# Patient Record
Sex: Male | Born: 1940 | Race: White | Hispanic: No | Marital: Married | Smoking: Never smoker
Health system: Southern US, Community
[De-identification: ages and names within clinical notes are randomized; demographics above are authoritative.]

## PROBLEM LIST (undated history)

## (undated) DIAGNOSIS — K4090 Unilateral inguinal hernia, without obstruction or gangrene, not specified as recurrent: Secondary | ICD-10-CM

## (undated) DIAGNOSIS — G9389 Other specified disorders of brain: Secondary | ICD-10-CM

## (undated) DIAGNOSIS — H9192 Unspecified hearing loss, left ear: Secondary | ICD-10-CM

## (undated) DIAGNOSIS — Q8502 Neurofibromatosis, type 2: Secondary | ICD-10-CM

## (undated) DIAGNOSIS — Z974 Presence of external hearing-aid: Secondary | ICD-10-CM

---

## 1969-08-24 HISTORY — PX: APPENDECTOMY: SHX54

## 1998-04-23 HISTORY — PX: CRANIOTOMY: SHX93

## 1998-05-21 ENCOUNTER — Inpatient Hospital Stay (HOSPITAL_COMMUNITY): Admission: AD | Admit: 1998-05-21 | Discharge: 1998-05-26 | Payer: Self-pay | Admitting: Family Medicine

## 2008-07-14 ENCOUNTER — Encounter: Admission: RE | Admit: 2008-07-14 | Discharge: 2008-07-14 | Payer: Self-pay | Admitting: Orthopedic Surgery

## 2009-08-09 ENCOUNTER — Encounter: Admission: RE | Admit: 2009-08-09 | Discharge: 2009-08-09 | Payer: Self-pay | Admitting: Orthopedic Surgery

## 2009-08-22 ENCOUNTER — Inpatient Hospital Stay (HOSPITAL_COMMUNITY): Admission: RE | Admit: 2009-08-22 | Discharge: 2009-08-24 | Payer: Self-pay | Admitting: Orthopedic Surgery

## 2009-08-22 HISTORY — PX: TOTAL HIP ARTHROPLASTY: SHX124

## 2010-03-10 ENCOUNTER — Encounter: Admission: RE | Admit: 2010-03-10 | Discharge: 2010-03-10 | Payer: Self-pay | Admitting: Family Medicine

## 2010-12-07 ENCOUNTER — Encounter
Admission: RE | Admit: 2010-12-07 | Discharge: 2010-12-07 | Payer: Self-pay | Source: Home / Self Care | Attending: Neurology | Admitting: Neurology

## 2011-03-30 LAB — CBC
HCT: 23.2 % — ABNORMAL LOW (ref 39.0–52.0)
Hemoglobin: 8.1 g/dL — ABNORMAL LOW (ref 13.0–17.0)
MCHC: 35 g/dL (ref 30.0–36.0)
MCV: 98.2 fL (ref 78.0–100.0)
Platelets: 91 K/uL — ABNORMAL LOW (ref 150–400)
RBC: 2.36 MIL/uL — ABNORMAL LOW (ref 4.22–5.81)
RDW: 13.5 % (ref 11.5–15.5)
WBC: 7.6 K/uL (ref 4.0–10.5)

## 2011-03-30 LAB — BASIC METABOLIC PANEL
BUN: 10 mg/dL (ref 6–23)
Chloride: 101 mEq/L (ref 96–112)
Creatinine, Ser: 0.84 mg/dL (ref 0.4–1.5)

## 2011-03-31 LAB — COMPREHENSIVE METABOLIC PANEL
ALT: 19 U/L (ref 0–53)
AST: 25 U/L (ref 0–37)
Albumin: 4.5 g/dL (ref 3.5–5.2)
Alkaline Phosphatase: 71 U/L (ref 39–117)
BUN: 13 mg/dL (ref 6–23)
Chloride: 100 mEq/L (ref 96–112)
GFR calc Af Amer: >60 mL/min (ref >60)
Potassium: 4.9 mEq/L (ref 3.5–5.1)
Total Bilirubin: 1.3 mg/dL — ABNORMAL HIGH (ref 0.3–1.2)

## 2011-03-31 LAB — URINALYSIS, ROUTINE W REFLEX MICROSCOPIC
Bilirubin Urine: NEGATIVE
Glucose, UA: NEGATIVE mg/dL
Ketones, ur: NEGATIVE mg/dL
pH: 7 (ref 5.0–8.0)

## 2011-03-31 LAB — CROSSMATCH
ABO/RH(D): B NEG
Antibody Screen: NEGATIVE

## 2011-03-31 LAB — DIFFERENTIAL
Basophils Absolute: 0 10*3/uL (ref 0.0–0.1)
Basophils Relative: 1 % (ref 0–1)
Eosinophils Absolute: 0.1 10*3/uL (ref 0.0–0.7)
Eosinophils Relative: 1 % (ref 0–5)
Monocytes Absolute: 1 10*3/uL (ref 0.1–1.0)

## 2011-03-31 LAB — URINE CULTURE
Colony Count: NO GROWTH
Culture: NO GROWTH

## 2011-03-31 LAB — CBC
HCT: 26.8 % — ABNORMAL LOW (ref 39.0–52.0)
HCT: 43.6 % (ref 39.0–52.0)
Hemoglobin: 8.9 g/dL — ABNORMAL LOW (ref 13.0–17.0)
Hemoglobin: 9.3 g/dL — ABNORMAL LOW (ref 13.0–17.0)
MCHC: 34.3 g/dL (ref 30.0–36.0)
MCHC: 34.6 g/dL (ref 30.0–36.0)
MCV: 99.4 fL (ref 78.0–100.0)
Platelets: 105 10*3/uL — ABNORMAL LOW (ref 150–400)
Platelets: 108 10*3/uL — ABNORMAL LOW (ref 150–400)
Platelets: 161 10*3/uL (ref 150–400)
RDW: 13.4 % (ref 11.5–15.5)
RDW: 13.8 % (ref 11.5–15.5)
WBC: 7.6 10*3/uL (ref 4.0–10.5)

## 2011-03-31 LAB — BASIC METABOLIC PANEL
BUN: 9 mg/dL (ref 6–23)
CO2: 26 mEq/L (ref 19–32)
GFR calc non Af Amer: >60 mL/min (ref >60)
Glucose, Bld: 119 mg/dL — ABNORMAL HIGH (ref 70–99)
Potassium: 3.5 mEq/L (ref 3.5–5.1)

## 2011-05-08 NOTE — Op Note (Signed)
[name], [name] NO.:  [id number]   MEDICAL RECORD NO.:  [id number]          PATIENT TYPE:  INP   LOCATION:  5005                         FACILITY:  [facility]   PHYSICIAN:  Mila Homer. Sherlean Foot, M.D. DATE OF BIRTH:  April 07, 1941   DATE OF PROCEDURE:  08/22/2009  DATE OF DISCHARGE:                               OPERATIVE REPORT   SURGEON:  Mila Homer. Sherlean Foot, MD   ASSISTANT:  1. Altamese Cabal, PA-C  2. Laural Benes. Su Hilt, PA-C   ANESTHESIA:  General.   PREOPERATIVE DIAGNOSIS:  Right hip osteoarthritis.   POSTOPERATIVE DIAGNOSIS:  Right hip osteoarthritis.   PROCEDURE:  Right total hip arthroplasty.   INDICATIONS FOR PROCEDURE:  The patient is a 70 year old white male with  failure of conservative measures for osteoarthritis of the hip.  Informed consent was obtained.   DESCRIPTION OF PROCEDURE:  The patient was laid supine and administered  general anesthesia, placed in left down and right up lateral decubitus  position.  After sterile prep and drape, the curvilinear incision was  centered over the trochanter.  I then made an incision in the fascia  lata, put in a holder in place, retracted with a Charnley retractor.  I  obtained hemostasis in the soft tissues.  I then elevated the anterior  one-half of the vastus lateralis, gluteus medius, and all the minimus  with stay sutures.  I then performed an anterior hip capsulectomy.  I  then marked out the neck cut and I made that cut with a sagittal saw.  I  removed the head and neck segment.  I then placed a Hohmann retractor  anterior and posterior to the acetabulum and switched sides of the table  with my PAs.  I then removed the labrum circumferentially.  I then  reamed up to 56-mm and put in a no holes, no spikes, fully fiber mesh  cup.  I snapped in a standard 2+ x  32-mm liner.  I went to the back  side of the table, flexed the knee and hip into a sterile pouch.  I then  gained access to the femoral canal,  reamed to size 12, broached to size  12, trialed with various head sizes, and chose a +35 head x 32-mm head.  I removed the trial components, copiously irrigated, tamped down a fully  porous coated stem, tamped on a +35 x 32-mm ball and then located the  hip.  Took it through aggressive range of motion, had good range of  motion.  No instability.  I then closed the lateralis medius minimus  sleeve through drill holes in the trochanter.  I then closed the fascia  lata with running #1 Vicryl sutures, deep soft tissue with buried 0-  Vicryl suture, subcuticular with 2-0 Vicryl stitch, and skin staples.   COMPLICATIONS:  None.   DRAINS:  None.           ______________________________  Mila Homer. Sherlean Foot, M.D.     SDL/MEDQ  D:  08/23/2009  T:  08/24/2009  Job:  [id number]

## 2011-11-22 ENCOUNTER — Other Ambulatory Visit: Payer: Self-pay | Admitting: Family Medicine

## 2011-11-23 ENCOUNTER — Ambulatory Visit
Admission: RE | Admit: 2011-11-23 | Discharge: 2011-11-23 | Disposition: A | Discharge status: Home / Self Care | Payer: Medicare Other | Source: Ambulatory Visit | Attending: Family Medicine | Admitting: Family Medicine

## 2011-11-23 DIAGNOSIS — R519 Headache, unspecified: Secondary | ICD-10-CM

## 2015-03-15 ENCOUNTER — Ambulatory Visit (INDEPENDENT_AMBULATORY_CARE_PROVIDER_SITE_OTHER): Payer: Medicare Other

## 2015-03-15 ENCOUNTER — Ambulatory Visit (INDEPENDENT_AMBULATORY_CARE_PROVIDER_SITE_OTHER): Payer: Medicare Other | Admitting: Podiatry

## 2015-03-15 VITALS — BP 122/66 | HR 68 | Resp 15

## 2015-03-15 DIAGNOSIS — M79675 Pain in left toe(s): Secondary | ICD-10-CM

## 2015-03-15 DIAGNOSIS — M8430XA Stress fracture, unspecified site, initial encounter for fracture: Secondary | ICD-10-CM | POA: Diagnosis not present

## 2015-03-15 DIAGNOSIS — M779 Enthesopathy, unspecified: Secondary | ICD-10-CM | POA: Diagnosis not present

## 2015-03-15 MED ORDER — GABAPENTIN 400 MG PO CAPS: 400.0000 mg | ORAL_CAPSULE | Freq: Two times a day (BID) | ORAL | Status: DC

## 2015-03-15 MED ORDER — TRIAMCINOLONE ACETONIDE 10 MG/ML IJ SUSP
10.0000 mg | Freq: Once | INTRAMUSCULAR | Status: AC
  Administered 2015-03-15: 10 mg

## 2015-03-15 NOTE — Progress Notes (Signed)
   Subjective:    Patient ID: Cristian Larson, male    DOB: 1941-12-09, 74 y.o.   MRN: [id number]  HPI Pt presents with pain in 2nd met, area reddened and slightly swollen   Review of Systems  All other systems reviewed and are negative.      Objective:   Physical Exam        Assessment & Plan:

## 2015-03-16 NOTE — Progress Notes (Signed)
Subjective:     Patient ID: Cristian Larson, male   DOB: 06/11/1941, 74 y.o.   MRN: [id number]  HPI patient presents stating I'm having a lot of pain in my second and third metatarsal joint and I get shooting burning pains with certain motions and I have been skiing twice in the last month and it seemed to worsen the condition   Review of Systems  All other systems reviewed and are negative.      Objective:   Physical Exam  Constitutional: He is oriented to person, place, and time.  Cardiovascular: Intact distal pulses.   Abdominal: Distention: possibility for inflammatory capsulitis mostly centered in the third MPJ versus possibility for stress fracture or possible nerve injury secondary to tight.  Musculoskeletal: Normal range of motion.  Neurological: He is oriented to person, place, and time.  Skin: Skin is warm.  Nursing note and vitals reviewed.  her vascular status intact with muscle strength adequate and range of motion adequate. He's got lesser digital deformity of the left foot with elevation of the second and third toes mild redness on top of the second toe left. He does have discomfort in the third metatarsal phalangeal joint moderate discomfort around the second and third metatarsal shaft and discomfort into the arch and also complains at times that is dorsal ankle seems to bother him. No specific element to this condition and it seems to occur with different types of activity      Assessment:     Ski boot that he wore. Rest of condition is listed above    Plan:     H&P and x-rays reviewed. I did do a proximal block around the third MPJ aspirated the joint giving out a small amount of clear fluid and injected with a quarter cc of dexamethasone Kenalog. I tried apply a fascially brace and then a short air fracture walker neither which patient could tolerate and at this time we'll start him on gabapentin 400 mg and see if it reduces any nerve irritation that might be present.  We will have to keep a close eye on this and decide if anything else is necessary and he will be seen back in 1 week

## 2015-03-21 ENCOUNTER — Telehealth: Payer: Self-pay | Admitting: *Deleted

## 2015-03-21 NOTE — Telephone Encounter (Addendum)
Pt states he had questions for Dr. Paulla Dolly.  I told him I may be able to help him and I would let Dr. Paulla Dolly know of his concerns.  Pt asked if he was to be weight bearing or what he need to be doing for his foot.  I reviewed pt's last visit, and instructed pt to wear a stiff shoe, and rest to decrease the movement of the structures of the foot that cause the foot to continue to irritate and injure the site, I also encouraged the pt to ice the foot 10 - 15 minutes 3 - 4 times a day to decrease pain and swelling and local inflammation.  I told pt I would call again if Dr. Paulla Dolly recommended changes.  Left message asking pt his status and to call with status.

## 2015-03-23 NOTE — Telephone Encounter (Signed)
Please call him and ask if he is improving. If not we may need to order test to see if he has stress fracture

## 2015-03-29 ENCOUNTER — Telehealth: Payer: Self-pay | Admitting: *Deleted

## 2015-03-29 NOTE — Telephone Encounter (Signed)
Pt states Dr. Paulla Dolly is treating for stress fracture, and he would like to know how long it takes to heal, how long should he weight bear, and he is currently in a regular shoe.  I told pt that it generally took 4 - 6 weeks for the bone callous to form around the fracture and to be seen on x-ray,  I told pt he should do only ADL, not a great deal of walking, and remain in a stiff shoe.  Pt asked if he would have permanent problem with the foot.  I told he routinely the stress fracture healed fine if immobilized in a stiff boot or shoe, but other factors like age, activity, location of fx,  also could affect.  Pt denies any pain and I told him I would advise Dr. Paulla Dolly of the pt condition.

## 2015-03-31 ENCOUNTER — Ambulatory Visit (INDEPENDENT_AMBULATORY_CARE_PROVIDER_SITE_OTHER): Payer: Medicare Other | Admitting: Podiatry

## 2015-03-31 ENCOUNTER — Ambulatory Visit (INDEPENDENT_AMBULATORY_CARE_PROVIDER_SITE_OTHER): Payer: Medicare Other

## 2015-03-31 ENCOUNTER — Encounter: Payer: Self-pay | Admitting: Podiatry

## 2015-03-31 VITALS — BP 117/71 | HR 69 | Resp 12

## 2015-03-31 DIAGNOSIS — M779 Enthesopathy, unspecified: Secondary | ICD-10-CM

## 2015-03-31 DIAGNOSIS — R52 Pain, unspecified: Secondary | ICD-10-CM | POA: Diagnosis not present

## 2015-04-03 NOTE — Progress Notes (Signed)
Subjective:     Patient ID: Cristian Larson, male   DOB: Apr 07, 1941, 74 y.o.   MRN: [id number]  HPI patient states I'm still getting pain in my left foot but it seems to have changed and is no longer as sharp as it was. I'm not sure if I have a fracture or not   Review of Systems     Objective:   Physical Exam Neurovascular status was found to be intact with diminished inflammation second MPJ left with moderate forefoot discomfort but no specific area of pain noted. Neuro tingling seems to have reduced with normal neurological sharp dull and vibratory sensation noted    Assessment:     Possibility for stress fracture versus inflammatory condition secondary to excessive activity    Plan:     Re-x-ray the foot and advised on gradual increase in activity ice therapy and support. We discussed a boutonniere get a hold off at the current time and less symptoms were to progress

## 2015-09-06 ENCOUNTER — Encounter (HOSPITAL_COMMUNITY): Payer: Self-pay | Admitting: Emergency Medicine

## 2015-09-06 ENCOUNTER — Emergency Department (HOSPITAL_COMMUNITY)
Admission: EM | Admit: 2015-09-06 | Discharge: 2015-09-06 | Disposition: A | Discharge status: Home / Self Care | Payer: Medicare Other | Attending: Emergency Medicine | Admitting: Emergency Medicine

## 2015-09-06 ENCOUNTER — Emergency Department (HOSPITAL_COMMUNITY): Payer: Medicare Other

## 2015-09-06 DIAGNOSIS — W01198A Fall on same level from slipping, tripping and stumbling with subsequent striking against other object, initial encounter: Secondary | ICD-10-CM | POA: Diagnosis not present

## 2015-09-06 DIAGNOSIS — Y9289 Other specified places as the place of occurrence of the external cause: Secondary | ICD-10-CM | POA: Diagnosis not present

## 2015-09-06 DIAGNOSIS — S0101XA Laceration without foreign body of scalp, initial encounter: Secondary | ICD-10-CM | POA: Diagnosis not present

## 2015-09-06 DIAGNOSIS — S0990XA Unspecified injury of head, initial encounter: Secondary | ICD-10-CM | POA: Diagnosis present

## 2015-09-06 DIAGNOSIS — Y998 Other external cause status: Secondary | ICD-10-CM | POA: Insufficient documentation

## 2015-09-06 DIAGNOSIS — Z79899 Other long term (current) drug therapy: Secondary | ICD-10-CM | POA: Insufficient documentation

## 2015-09-06 DIAGNOSIS — R55 Syncope and collapse: Secondary | ICD-10-CM | POA: Diagnosis not present

## 2015-09-06 DIAGNOSIS — Y9389 Activity, other specified: Secondary | ICD-10-CM | POA: Insufficient documentation

## 2015-09-06 DIAGNOSIS — W19XXXA Unspecified fall, initial encounter: Secondary | ICD-10-CM

## 2015-09-06 LAB — CBC WITH DIFFERENTIAL/PLATELET
BASOS ABS: 0 10*3/uL (ref 0.0–0.1)
BASOS PCT: 0 % (ref 0–1)
EOS ABS: 0 10*3/uL (ref 0.0–0.7)
EOS PCT: 0 % (ref 0–5)
HCT: 41.9 % (ref 39.0–52.0)
Hemoglobin: 14.2 g/dL (ref 13.0–17.0)
Lymphocytes Relative: 13 % (ref 12–46)
Lymphs Abs: 1.5 10*3/uL (ref 0.7–4.0)
MCH: 32.6 pg (ref 26.0–34.0)
MCHC: 33.9 g/dL (ref 30.0–36.0)
MCV: 96.1 fL (ref 78.0–100.0)
MONO ABS: 1 10*3/uL (ref 0.1–1.0)
Monocytes Relative: 10 % (ref 3–12)
Neutro Abs: 8.4 10*3/uL — ABNORMAL HIGH (ref 1.7–7.7)
Neutrophils Relative %: 77 % (ref 43–77)
PLATELETS: 142 10*3/uL — AB (ref 150–400)
RBC: 4.36 MIL/uL (ref 4.22–5.81)
RDW: 13 % (ref 11.5–15.5)
WBC: 11 10*3/uL — ABNORMAL HIGH (ref 4.0–10.5)

## 2015-09-06 LAB — COMPREHENSIVE METABOLIC PANEL
ALT: 15 U/L — AB (ref 17–63)
AST: 24 U/L (ref 15–41)
Albumin: 3.8 g/dL (ref 3.5–5.0)
Alkaline Phosphatase: 51 U/L (ref 38–126)
Anion gap: 6 (ref 5–15)
BILIRUBIN TOTAL: 0.5 mg/dL (ref 0.3–1.2)
BUN: 13 mg/dL (ref 6–20)
CALCIUM: 8.7 mg/dL — AB (ref 8.9–10.3)
CO2: 27 mmol/L (ref 22–32)
CREATININE: 1.02 mg/dL (ref 0.61–1.24)
Chloride: 102 mmol/L (ref 101–111)
Glucose, Bld: 102 mg/dL — ABNORMAL HIGH (ref 65–99)
Potassium: 4.2 mmol/L (ref 3.5–5.1)
Sodium: 135 mmol/L (ref 135–145)
TOTAL PROTEIN: 6 g/dL — AB (ref 6.5–8.1)

## 2015-09-06 LAB — URINALYSIS, ROUTINE W REFLEX MICROSCOPIC
Bilirubin Urine: NEGATIVE
GLUCOSE, UA: NEGATIVE mg/dL
HGB URINE DIPSTICK: NEGATIVE
Ketones, ur: 15 mg/dL — AB
Leukocytes, UA: NEGATIVE
Nitrite: NEGATIVE
PROTEIN: NEGATIVE mg/dL
Specific Gravity, Urine: 1.015 (ref 1.005–1.030)
Urobilinogen, UA: 0.2 mg/dL (ref 0.0–1.0)
pH: 7.5 (ref 5.0–8.0)

## 2015-09-06 LAB — PROTIME-INR
INR: 1.1 (ref 0.00–1.49)
PROTHROMBIN TIME: 14.4 s (ref 11.6–15.2)

## 2015-09-06 LAB — TROPONIN I

## 2015-09-06 MED ORDER — LIDOCAINE-EPINEPHRINE (PF) 2 %-1:200000 IJ SOLN
10.0000 mL | Freq: Once | INTRAMUSCULAR | Status: AC
  Administered 2015-09-06: 10 mL
  Filled 2015-09-06: qty 20

## 2015-09-06 MED ORDER — SODIUM CHLORIDE 0.9 % IV BOLUS (SEPSIS)
500.0000 mL | Freq: Once | INTRAVENOUS | Status: AC
  Administered 2015-09-06: 500 mL via INTRAVENOUS

## 2015-09-06 NOTE — ED Notes (Signed)
Patient comes from [facility] at [place] states he was getting out the Hot tube and fell and hit head. Patient has Laceration to the back of the head. Patient is Alert and Oriented on arrival. Bleeding is controled. Patient is not on any Blood thinner. Patient States he does not remember what happen.

## 2015-09-06 NOTE — Discharge Instructions (Signed)
Syncope °Syncope is a medical term for fainting or passing out. This means you lose consciousness and drop to the ground. People are generally unconscious for less than 5 minutes. You may have some muscle twitches for up to 15 seconds before waking up and returning to normal. Syncope occurs more often in older adults, but it can happen to anyone. While most causes of syncope are not dangerous, syncope can be a sign of a serious medical problem. It is important to seek medical care.  °CAUSES  °Syncope is caused by a sudden drop in blood flow to the brain. The specific cause is often not determined. Factors that can bring on syncope include: °· Taking medicines that lower blood pressure. °· Sudden changes in posture, such as standing up quickly. °· Taking more medicine than prescribed. °· Standing in one place for too long. °· Seizure disorders. °· Dehydration and excessive exposure to heat. °· Low blood sugar (hypoglycemia). °· Straining to have a bowel movement. °· Heart disease, irregular heartbeat, or other circulatory problems. °· Fear, emotional distress, seeing blood, or severe pain. °SYMPTOMS  °Right before fainting, you may: °· Feel dizzy or light-headed. °· Feel nauseous. °· See all white or all black in your field of vision. °· Have cold, clammy skin. °DIAGNOSIS  °Your health care provider will ask about your symptoms, perform a physical exam, and perform an electrocardiogram (ECG) to record the electrical activity of your heart. Your health care provider may also perform other heart or blood tests to determine the cause of your syncope which may include: °· Transthoracic echocardiogram (TTE). During echocardiography, sound waves are used to evaluate how blood flows through your heart. °· Transesophageal echocardiogram (TEE). °· Cardiac monitoring. This allows your health care provider to monitor your heart rate and rhythm in real time. °· Holter monitor. This is a portable device that records your  heartbeat and can help diagnose heart arrhythmias. It allows your health care provider to track your heart activity for several days, if needed. °· Stress tests by exercise or by giving medicine that makes the heart beat faster. °TREATMENT  °In most cases, no treatment is needed. Depending on the cause of your syncope, your health care provider may recommend changing or stopping some of your medicines. °HOME CARE INSTRUCTIONS °· Have someone stay with you until you feel stable. °· Do not drive, use machinery, or play sports until your health care provider says it is okay. °· Keep all follow-up appointments as directed by your health care provider. °· Lie down right away if you start feeling like you might faint. Breathe deeply and steadily. Wait until all the symptoms have passed. °· Drink enough fluids to keep your urine clear or pale yellow. °· If you are taking blood pressure or heart medicine, get up slowly and take several minutes to sit and then stand. This can reduce dizziness. °SEEK IMMEDIATE MEDICAL CARE IF:  °· You have a severe headache. °· You have unusual pain in the chest, abdomen, or back. °· You are bleeding from your mouth or rectum, or you have black or tarry stool. °· You have an irregular or very fast heartbeat. °· You have pain with breathing. °· You have repeated fainting or seizure-like jerking during an episode. °· You faint when sitting or lying down. °· You have confusion. °· You have trouble walking. °· You have severe weakness. °· You have vision problems. °If you fainted, call your local emergency services (911 in [place] Do not drive   yourself to the hospital.  MAKE SURE YOU:  Understand these instructions.  Will watch your condition.  Will get help right away if you are not doing well or get worse. Document Released: 12/10/2005 Document Revised: 12/15/2013 Document Reviewed: 02/08/2012 [facility] Patient Information 2015 [place], [place]. This information is not intended to replace  advice given to you by your health care provider. Make sure you discuss any questions you have with your health care provider. Laceration Care, Adult A laceration is a cut or lesion that goes through all layers of the skin and into the tissue just beneath the skin. TREATMENT  Some lacerations may not require closure. Some lacerations may not be able to be closed due to an increased risk of infection. It is important to see your caregiver as soon as possible after an injury to minimize the risk of infection and maximize the opportunity for successful closure. If closure is appropriate, pain medicines may be given, if needed. The wound will be cleaned to help prevent infection. Your caregiver will use stitches (sutures), staples, wound glue (adhesive), or skin adhesive strips to repair the laceration. These tools bring the skin edges together to allow for faster healing and a better cosmetic outcome. However, all wounds will heal with a scar. Once the wound has healed, scarring can be minimized by covering the wound with sunscreen during the day for 1 full year. HOME CARE INSTRUCTIONS  For sutures or staples:  Keep the wound clean and dry.  If you were given a bandage (dressing), you should change it at least once a day. Also, change the dressing if it becomes wet or dirty, or as directed by your caregiver.  Wash the wound with soap and water 2 times a day. Rinse the wound off with water to remove all soap. Pat the wound dry with a clean towel.  After cleaning, apply a thin layer of the antibiotic ointment as recommended by your caregiver. This will help prevent infection and keep the dressing from sticking.  You may shower as usual after the first 24 hours. Do not soak the wound in water until the sutures are removed.  Only take over-the-counter or prescription medicines for pain, discomfort, or fever as directed by your caregiver.  Get your sutures or staples removed as directed by your  caregiver. For skin adhesive strips:  Keep the wound clean and dry.  Do not get the skin adhesive strips wet. You may bathe carefully, using caution to keep the wound dry.  If the wound gets wet, pat it dry with a clean towel.  Skin adhesive strips will fall off on their own. You may trim the strips as the wound heals. Do not remove skin adhesive strips that are still stuck to the wound. They will fall off in time. For wound adhesive:  You may briefly wet your wound in the shower or bath. Do not soak or scrub the wound. Do not swim. Avoid periods of heavy perspiration until the skin adhesive has fallen off on its own. After showering or bathing, gently pat the wound dry with a clean towel.  Do not apply liquid medicine, cream medicine, or ointment medicine to your wound while the skin adhesive is in place. This may loosen the film before your wound is healed.  If a dressing is placed over the wound, be careful not to apply tape directly over the skin adhesive. This may cause the adhesive to be pulled off before the wound is healed.  Avoid prolonged  exposure to sunlight or tanning lamps while the skin adhesive is in place. Exposure to ultraviolet light in the first year will darken the scar.  The skin adhesive will usually remain in place for 5 to 10 days, then naturally fall off the skin. Do not pick at the adhesive film. You may need a tetanus shot if:  You cannot remember when you had your last tetanus shot.  You have never had a tetanus shot. If you get a tetanus shot, your arm may swell, get red, and feel warm to the touch. This is common and not a problem. If you need a tetanus shot and you choose not to have one, there is a rare chance of getting tetanus. Sickness from tetanus can be serious. SEEK MEDICAL CARE IF:   You have redness, swelling, or increasing pain in the wound.  You see a red line that goes away from the wound.  You have yellowish-white fluid (pus) coming from  the wound.  You have a fever.  You notice a bad smell coming from the wound or dressing.  Your wound breaks open before or after sutures have been removed.  You notice something coming out of the wound such as wood or glass.  Your wound is on your hand or foot and you cannot move a finger or toe. SEEK IMMEDIATE MEDICAL CARE IF:   Your pain is not controlled with prescribed medicine.  You have severe swelling around the wound causing pain and numbness or a change in color in your arm, hand, leg, or foot.  Your wound splits open and starts bleeding.  You have worsening numbness, weakness, or loss of function of any joint around or beyond the wound.  You develop painful lumps near the wound or on the skin anywhere on your body. MAKE SURE YOU:   Understand these instructions.  Will watch your condition.  Will get help right away if you are not doing well or get worse. Document Released: 12/10/2005 Document Revised: 03/03/2012 Document Reviewed: 06/05/2011 [facility] Patient Information 2015 [place], [place]. This information is not intended to replace advice given to you by your health care provider. Make sure you discuss any questions you have with your health care provider. Head Injury You have received a head injury. It does not appear serious at this time. Headaches and vomiting are common following head injury. It should be easy to awaken from sleeping. Sometimes it is necessary for you to stay in the emergency department for a while for observation. Sometimes admission to the hospital may be needed. After injuries such as yours, most problems occur within the first 24 hours, but side effects may occur up to 7-10 days after the injury. It is important for you to carefully monitor your condition and contact your health care provider or seek immediate medical care if there is a change in your condition. WHAT ARE THE TYPES OF HEAD INJURIES? Head injuries can be as minor as a bump. Some  head injuries can be more severe. More severe head injuries include:  A jarring injury to the brain (concussion).  A bruise of the brain (contusion). This mean there is bleeding in the brain that can cause swelling.  A cracked skull (skull fracture).  Bleeding in the brain that collects, clots, and forms a bump (hematoma). WHAT CAUSES A HEAD INJURY? A serious head injury is most likely to happen to someone who is in a car wreck and is not wearing a seat belt. Other causes of  major head injuries include bicycle or motorcycle accidents, sports injuries, and falls. HOW ARE HEAD INJURIES DIAGNOSED? A complete history of the event leading to the injury and your current symptoms will be helpful in diagnosing head injuries. Many times, pictures of the brain, such as CT or MRI are needed to see the extent of the injury. Often, an overnight hospital stay is necessary for observation.  WHEN SHOULD I SEEK IMMEDIATE MEDICAL CARE?  You should get help right away if:  You have confusion or drowsiness.  You feel sick to your stomach (nauseous) or have continued, forceful vomiting.  You have dizziness or unsteadiness that is getting worse.  You have severe, continued headaches not relieved by medicine. Only take over-the-counter or prescription medicines for pain, fever, or discomfort as directed by your health care provider.  You do not have normal function of the arms or legs or are unable to walk.  You notice changes in the black spots in the center of the colored part of your eye (pupil).  You have a clear or bloody fluid coming from your nose or ears.  You have a loss of vision. During the next 24 hours after the injury, you must stay with someone who can watch you for the warning signs. This person should contact local emergency services (911 in the [place] if you have seizures, you become unconscious, or you are unable to wake up. HOW CAN I PREVENT A HEAD INJURY IN THE FUTURE? The most  important factor for preventing major head injuries is avoiding motor vehicle accidents. To minimize the potential for damage to your head, it is crucial to wear seat belts while riding in motor vehicles. Wearing helmets while bike riding and playing collision sports (like football) is also helpful. Also, avoiding dangerous activities around the house will further help reduce your risk of head injury.  WHEN CAN I RETURN TO NORMAL ACTIVITIES AND ATHLETICS? You should be reevaluated by your health care provider before returning to these activities. If you have any of the following symptoms, you should not return to activities or contact sports until 1 week after the symptoms have stopped:  Persistent headache.  Dizziness or vertigo.  Poor attention and concentration.  Confusion.  Memory problems.  Nausea or vomiting.  Fatigue or tire easily.  Irritability.  Intolerant of bright lights or loud noises.  Anxiety or depression.  Disturbed sleep. MAKE SURE YOU:   Understand these instructions.  Will watch your condition.  Will get help right away if you are not doing well or get worse. Document Released: 12/10/2005 Document Revised: 12/15/2013 Document Reviewed: 08/17/2013 [facility] Patient Information 2015 [place], [place]. This information is not intended to replace advice given to you by your health care provider. Make sure you discuss any questions you have with your health care provider.

## 2015-09-06 NOTE — ED Notes (Signed)
MD made aware patient requested to leave. MD advised to let patient know she will be there in a few. Patient advised.

## 2015-09-06 NOTE — ED Provider Notes (Signed)
CSN: [id number]     Arrival date & time 09/06/15  1723 History   First MD Initiated Contact with Patient 09/06/15 1816     Chief Complaint  Patient presents with  . Fall     (Consider location/radiation/quality/duration/timing/severity/associated sxs/prior Treatment) HPI Patient reports he was getting out of his hot And going over the pool when he fell and hit his head. He is not sure if he got dizzy and passed out or if he tripped. He thinks he may have been slightly lightheaded and then tripped. He hit the back of his head and has a laceration. Patient states he has very poor memory at baseline so it would not be unusual for him not to recall the events surrounding the episode.. This has been reportedly assessed on an outpatient basis and the patient does not have any formal dementia diagnosis by his report. Patient denies he experienced any chest pain, palpitation. He reports that he has had some mild diarrheal illness and nausea recently. He states he traveled to [facility] over the weekend and had some minor symptoms that he attributed to food poisoning. He reports he had not actually had anything to eat today. He denies any alcohol use. He denies any significant medical history. History reviewed. No pertinent past medical history. Past Surgical History  Procedure Laterality Date  . Appendectomy     No family history on file. Social History  Substance Use Topics  . Smoking status: Never Smoker   . Smokeless tobacco: None  . Alcohol Use: 0.0 oz/week    0 Standard drinks or equivalent per week    Review of Systems 10 Systems reviewed and are negative for acute change except as noted in the HPI.   Allergies  Review of patient's allergies indicates no known allergies.  Home Medications   Prior to Admission medications   Medication Sig Start Date End Date Taking? 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   Yes Historical Provider, MD  tamsulosin (FLOMAX) 0.4 MG CAPS capsule Take 0.4 mg by mouth daily after supper.  08/25/15  Yes Historical Provider, MD   BP 119/65 mmHg  Pulse 70  Temp(Src) 97.7 F (36.5 C) (Oral)  Resp 19  SpO2 99% Physical Exam  Constitutional: He is oriented to person, place, and time. He appears well-developed and well-nourished.  HENT:  1.5 cm linear laceration to the left parietal scalp. No active bleeding at this time.  Internal hearing aid in the right ear canal, left ear canal normal no hemotympanum.  No oral or dental injury.  Eyes: EOM are normal. Pupils are equal, round, and reactive to light.  Neck: Neck supple.  Cardiovascular: Normal rate, regular rhythm, normal heart sounds and intact distal pulses.   Pulmonary/Chest: Effort normal and breath sounds normal.  Abdominal: Soft. Bowel sounds are normal. He exhibits no distension. There is no tenderness.  Musculoskeletal: Normal range of motion. He exhibits no edema.  Neurological: He is alert and oriented to person, place, and time. He has normal strength. No cranial nerve deficit. He exhibits normal muscle tone. Coordination normal. GCS eye subscore is 4. GCS verbal subscore is 5. GCS motor subscore is 6.  Skin: Skin is warm, dry and intact.  Psychiatric: He has a normal mood and affect.    ED Course  LACERATION REPAIR Date/Time: 09/09/2015 3:15 PM Performed by: Charlesetta Shanks Authorized by: Charlesetta Shanks Body area: head/neck Location details: scalp  Laceration length: 1.5 cm Foreign bodies: no foreign bodies Tendon involvement: none Nerve involvement: none Vascular damage: no Anesthesia: local infiltration Local anesthetic: lidocaine 1% without epinephrine Irrigation solution: saline Amount of cleaning: standard Debridement: none Degree of undermining: none Skin closure: staples Number of sutures: 2 Technique: simple Approximation: close Approximation difficulty: simple    (including critical care time)  Labs Review Labs Reviewed  COMPREHENSIVE METABOLIC PANEL - Abnormal; Notable for the following:    Glucose, Bld 102 (*)    Calcium 8.7 (*)    Total Protein 6.0 (*)    ALT 15 (*)    All other components within normal limits  CBC WITH DIFFERENTIAL/PLATELET - Abnormal; Notable for the following:    WBC 11.0 (*)    Platelets 142 (*)    Neutro Abs 8.4 (*)    All other components within normal limits  URINALYSIS, ROUTINE W REFLEX MICROSCOPIC (NOT AT [facility]) - Abnormal; Notable for the following:    APPearance HAZY (*)    Ketones, ur 15 (*)    All other components within normal limits  TROPONIN I  PROTIME-INR    Imaging Review No results found. I have personally reviewed and evaluated these images and lab results as part of my medical decision-making.   EKG Interpretation   Date/Time:  Tuesday September 06 2015 17:30:44 EDT Ventricular Rate:  66 PR Interval:  207 QRS Duration: 91 QT Interval:  394 QTC Calculation: 413 R Axis:   17 Text Interpretation:  Sinus rhythm Probable left atrial enlargement  Minimal ST elevation, inferior leads ED PHYSICIAN INTERPRETATION AVAILABLE  IN [facility] Confirmed by TEST, Record ([id number]) on 09/07/2015 8:03:39  AM      MDM   Final diagnoses:  Fall, initial encounter  Near syncope  Scalp laceration, initial encounter   Patient presents with fall consistent with a likely orthostatic near syncope with loss of balance. Patient had not had anything to eat today and had been in his hot tub was stepping out of the pool. He thinks he might of been a little lightheaded causing himself to trip and fall. He hit the back of his head and has a laceration. He went to see his family doctor and they sent him to the emergency department for further evaluation. The patient is alert and appropriate. There is no evidence of other additional injury. CT head and cervical spine do not show acute intracranial or cervical injury.  Patient is neurologically intact and appropriate. Staples were used for closure of a simple scalp laceration. Patient is not on any antiplatelets. He is discharged with his wife with head injury instructions.    Charlesetta Shanks, MD 09/09/15 1520

## 2015-09-06 NOTE — ED Notes (Signed)
Laceration noted to be about 1cm

## 2016-05-17 ENCOUNTER — Ambulatory Visit: Payer: Self-pay | Admitting: General Surgery

## 2016-06-13 ENCOUNTER — Encounter (HOSPITAL_BASED_OUTPATIENT_CLINIC_OR_DEPARTMENT_OTHER): Payer: Self-pay | Admitting: *Deleted

## 2016-06-13 NOTE — Progress Notes (Signed)
NPO AFTER MN.  ARRIVE AT 0915.  NEEDS HG.  WILL DO HIBICLENS SHOWER HS BEFORE AND AM DOS.

## 2016-07-03 ENCOUNTER — Encounter (HOSPITAL_BASED_OUTPATIENT_CLINIC_OR_DEPARTMENT_OTHER): Payer: Self-pay | Admitting: *Deleted

## 2016-07-03 NOTE — Progress Notes (Signed)
SPOKE W/ PT'S WIFE.  NPO AFTER MN.  ARRIVE AT 1000.  NEEDS HG.  WILL DO HIBICLENS SHOWER HS BEFORE AND AM DOS .

## 2016-07-12 ENCOUNTER — Encounter (HOSPITAL_BASED_OUTPATIENT_CLINIC_OR_DEPARTMENT_OTHER): Payer: Self-pay

## 2016-07-12 ENCOUNTER — Ambulatory Visit (HOSPITAL_BASED_OUTPATIENT_CLINIC_OR_DEPARTMENT_OTHER)
Admission: RE | Admit: 2016-07-12 | Discharge: 2016-07-12 | Disposition: A | Discharge status: Home / Self Care | Payer: Medicare Other | Source: Ambulatory Visit | Attending: General Surgery | Admitting: General Surgery

## 2016-07-12 ENCOUNTER — Ambulatory Visit (HOSPITAL_BASED_OUTPATIENT_CLINIC_OR_DEPARTMENT_OTHER): Payer: Medicare Other | Admitting: Anesthesiology

## 2016-07-12 ENCOUNTER — Encounter (HOSPITAL_BASED_OUTPATIENT_CLINIC_OR_DEPARTMENT_OTHER)
Admission: RE | Disposition: A | Discharge status: Home / Self Care | Payer: Self-pay | Source: Ambulatory Visit | Attending: General Surgery

## 2016-07-12 DIAGNOSIS — Z9049 Acquired absence of other specified parts of digestive tract: Secondary | ICD-10-CM | POA: Diagnosis not present

## 2016-07-12 DIAGNOSIS — Z96641 Presence of right artificial hip joint: Secondary | ICD-10-CM | POA: Insufficient documentation

## 2016-07-12 DIAGNOSIS — D1779 Benign lipomatous neoplasm of other sites: Secondary | ICD-10-CM | POA: Insufficient documentation

## 2016-07-12 DIAGNOSIS — Z79899 Other long term (current) drug therapy: Secondary | ICD-10-CM | POA: Diagnosis not present

## 2016-07-12 DIAGNOSIS — K4090 Unilateral inguinal hernia, without obstruction or gangrene, not specified as recurrent: Secondary | ICD-10-CM | POA: Diagnosis not present

## 2016-07-12 HISTORY — DX: Neurofibromatosis, type 2: Q85.02

## 2016-07-12 HISTORY — PX: INSERTION OF MESH: SHX5868

## 2016-07-12 HISTORY — DX: Presence of external hearing-aid: Z97.4

## 2016-07-12 HISTORY — DX: Unilateral inguinal hernia, without obstruction or gangrene, not specified as recurrent: K40.90

## 2016-07-12 HISTORY — DX: Other specified disorders of brain: G93.89

## 2016-07-12 HISTORY — PX: INGUINAL HERNIA REPAIR: SHX194

## 2016-07-12 HISTORY — DX: Unspecified hearing loss, left ear: H91.92

## 2016-07-12 LAB — POCT HEMOGLOBIN-HEMACUE: Hemoglobin: 14.6 g/dL (ref 13.0–17.0)

## 2016-07-12 SURGERY — REPAIR, HERNIA, INGUINAL, ADULT
Anesthesia: General | Site: Inguinal | Laterality: Right

## 2016-07-12 MED ORDER — LIDOCAINE HCL (CARDIAC) 20 MG/ML IV SOLN
INTRAVENOUS | Status: AC
Start: 2016-07-12 — End: 2016-07-12
  Filled 2016-07-12: qty 5

## 2016-07-12 MED ORDER — EPHEDRINE SULFATE 50 MG/ML IJ SOLN
INTRAMUSCULAR | Status: DC | PRN
  Administered 2016-07-12: 10 mg via INTRAVENOUS
  Administered 2016-07-12 (×2): 15 mg via INTRAVENOUS
  Administered 2016-07-12: 10 mg via INTRAVENOUS

## 2016-07-12 MED ORDER — CEFAZOLIN SODIUM-DEXTROSE 2-4 GM/100ML-% IV SOLN
2.0000 g | INTRAVENOUS | Status: AC
  Administered 2016-07-12: 2 g via INTRAVENOUS
  Filled 2016-07-12: qty 100

## 2016-07-12 MED ORDER — HYDROCODONE-ACETAMINOPHEN 5-325 MG PO TABS: 1.0000 | ORAL_TABLET | Freq: Four times a day (QID) | ORAL | Status: AC | PRN

## 2016-07-12 MED ORDER — FENTANYL CITRATE (PF) 100 MCG/2ML IJ SOLN
25.0000 ug | INTRAMUSCULAR | Status: DC | PRN
  Filled 2016-07-12: qty 1

## 2016-07-12 MED ORDER — ONDANSETRON HCL 4 MG/2ML IJ SOLN
INTRAMUSCULAR | Status: DC | PRN
  Administered 2016-07-12: 4 mg via INTRAVENOUS

## 2016-07-12 MED ORDER — LACTATED RINGERS IV SOLN
INTRAVENOUS | Status: DC
  Administered 2016-07-12 (×2): via INTRAVENOUS
  Filled 2016-07-12: qty 1000

## 2016-07-12 MED ORDER — EPHEDRINE SULFATE 50 MG/ML IJ SOLN
INTRAMUSCULAR | Status: AC
  Filled 2016-07-12: qty 1

## 2016-07-12 MED ORDER — PROPOFOL 10 MG/ML IV BOLUS
INTRAVENOUS | Status: DC | PRN
  Administered 2016-07-12: 30 mg via INTRAVENOUS
  Administered 2016-07-12: 150 mg via INTRAVENOUS

## 2016-07-12 MED ORDER — BUPIVACAINE-EPINEPHRINE 0.25% -1:200000 IJ SOLN
INTRAMUSCULAR | Status: DC | PRN
  Administered 2016-07-12: 10 mL

## 2016-07-12 MED ORDER — LIDOCAINE HCL (CARDIAC) 20 MG/ML IV SOLN
INTRAVENOUS | Status: DC | PRN
  Administered 2016-07-12: 100 mg via INTRAVENOUS

## 2016-07-12 MED ORDER — KETOROLAC TROMETHAMINE 30 MG/ML IJ SOLN
INTRAMUSCULAR | Status: DC | PRN
  Administered 2016-07-12: 15 mg via INTRAVENOUS

## 2016-07-12 MED ORDER — PROPOFOL 10 MG/ML IV BOLUS
INTRAVENOUS | Status: AC
  Filled 2016-07-12: qty 20

## 2016-07-12 MED ORDER — ACETAMINOPHEN 160 MG/5ML PO SOLN
ORAL | Status: AC
  Filled 2016-07-12: qty 20.3

## 2016-07-12 MED ORDER — FENTANYL CITRATE (PF) 100 MCG/2ML IJ SOLN
INTRAMUSCULAR | Status: DC | PRN
  Administered 2016-07-12: 50 ug via INTRAVENOUS
  Administered 2016-07-12 (×2): 25 ug via INTRAVENOUS

## 2016-07-12 MED ORDER — DEXAMETHASONE SODIUM PHOSPHATE 4 MG/ML IJ SOLN
INTRAMUSCULAR | Status: DC | PRN
  Administered 2016-07-12: 10 mg via INTRAVENOUS

## 2016-07-12 MED ORDER — SODIUM CHLORIDE 0.9 % IV SOLN
INTRAVENOUS | Status: DC | PRN
  Administered 2016-07-12: 40 mL

## 2016-07-12 MED ORDER — IBUPROFEN 800 MG PO TABS: 800.0000 mg | ORAL_TABLET | Freq: Three times a day (TID) | ORAL | Status: AC | PRN

## 2016-07-12 MED ORDER — CHLORHEXIDINE GLUCONATE 4 % EX LIQD
1.0000 "application " | Freq: Once | CUTANEOUS | Status: DC
  Filled 2016-07-12: qty 15

## 2016-07-12 MED ORDER — ONDANSETRON HCL 4 MG/2ML IJ SOLN
INTRAMUSCULAR | Status: AC
  Filled 2016-07-12: qty 2

## 2016-07-12 MED ORDER — ACETAMINOPHEN 160 MG/5ML PO SOLN
650.0000 mg | Freq: Once | ORAL | Status: AC
  Administered 2016-07-12: 650 mg via ORAL
  Filled 2016-07-12: qty 20.3

## 2016-07-12 MED ORDER — CEFAZOLIN SODIUM-DEXTROSE 2-4 GM/100ML-% IV SOLN
INTRAVENOUS | Status: AC
  Filled 2016-07-12: qty 100

## 2016-07-12 MED ORDER — KETOROLAC TROMETHAMINE 30 MG/ML IJ SOLN
INTRAMUSCULAR | Status: AC
  Filled 2016-07-12: qty 1

## 2016-07-12 MED ORDER — FENTANYL CITRATE (PF) 100 MCG/2ML IJ SOLN
INTRAMUSCULAR | Status: AC
  Filled 2016-07-12: qty 2

## 2016-07-12 MED ORDER — DEXAMETHASONE SODIUM PHOSPHATE 10 MG/ML IJ SOLN
INTRAMUSCULAR | Status: AC
  Filled 2016-07-12: qty 1

## 2016-07-12 SURGICAL SUPPLY — 47 items
BLADE CLIPPER SENSICLIP SURGIC (BLADE) ×3 IMPLANT
BLADE HEX COATED 2.75 (ELECTRODE) ×3 IMPLANT
CANISTER SUCTION 2500CC (MISCELLANEOUS) ×3 IMPLANT
CELLS DAT CNTRL 66122 CELL SVR (MISCELLANEOUS) IMPLANT
CHLORAPREP W/TINT 26ML (MISCELLANEOUS) ×3 IMPLANT
COVER BACK TABLE 60X90IN (DRAPES) ×3 IMPLANT
COVER MAYO STAND STRL (DRAPES) ×3 IMPLANT
DECANTER SPIKE VIAL GLASS SM (MISCELLANEOUS) IMPLANT
DRAIN PENROSE 18X1/4 LTX STRL (WOUND CARE) ×3 IMPLANT
DRAPE LAPAROSCOPIC ABDOMINAL (DRAPES) ×3 IMPLANT
DRAPE UTILITY XL STRL (DRAPES) ×3 IMPLANT
ELECT REM PT RETURN 9FT ADLT (ELECTROSURGICAL) ×3
ELECTRODE REM PT RTRN 9FT ADLT (ELECTROSURGICAL) ×1 IMPLANT
GLOVE BIOGEL PI IND STRL 7.0 (GLOVE) ×1 IMPLANT
GLOVE BIOGEL PI IND STRL 7.5 (GLOVE) ×3 IMPLANT
GLOVE BIOGEL PI INDICATOR 7.0 (GLOVE) ×2
GLOVE BIOGEL PI INDICATOR 7.5 (GLOVE) ×6
GLOVE SURG SS PI 7.0 STRL IVOR (GLOVE) ×3 IMPLANT
GOWN STRL REUS W/ TWL LRG LVL3 (GOWN DISPOSABLE) ×1 IMPLANT
GOWN STRL REUS W/TWL LRG LVL3 (GOWN DISPOSABLE) ×5 IMPLANT
KIT ROOM TURNOVER WOR (KITS) ×3 IMPLANT
LIQUID BAND (GAUZE/BANDAGES/DRESSINGS) ×3 IMPLANT
MESH BARD SOFT 3X6IN (Mesh General) ×3 IMPLANT
NEEDLE HYPO 25X1 1.5 SAFETY (NEEDLE) ×3 IMPLANT
NS IRRIG 500ML POUR BTL (IV SOLUTION) ×3 IMPLANT
PACK BASIN DAY SURGERY FS (CUSTOM PROCEDURE TRAY) ×3 IMPLANT
PAD ARMBOARD 7.5X6 YLW CONV (MISCELLANEOUS) ×3 IMPLANT
PENCIL BUTTON HOLSTER BLD 10FT (ELECTRODE) ×3 IMPLANT
RTRCTR WOUND ALEXIS 18CM MED (MISCELLANEOUS)
RTRCTR WOUND ALEXIS 18CM SML (INSTRUMENTS)
SAVER CELL AAL HAEMONETICS (INSTRUMENTS) IMPLANT
SPONGE LAP 4X18 X RAY DECT (DISPOSABLE) IMPLANT
SUT MNCRL AB 4-0 PS2 18 (SUTURE) ×3 IMPLANT
SUT PROLENE 2 0 CT2 30 (SUTURE) ×12 IMPLANT
SUT SILK 3 0 TIES 17X18 (SUTURE)
SUT SILK 3-0 18XBRD TIE BLK (SUTURE) IMPLANT
SUT VIC AB 2-0 SH 27 (SUTURE) ×4
SUT VIC AB 2-0 SH 27X BRD (SUTURE) ×1 IMPLANT
SUT VIC AB 2-0 SH 27XBRD (SUTURE) ×1 IMPLANT
SUT VIC AB 3-0 SH 27 (SUTURE) ×4
SUT VIC AB 3-0 SH 27X BRD (SUTURE) ×2 IMPLANT
SYR BULB IRRIGATION 50ML (SYRINGE) ×3 IMPLANT
SYR CONTROL 10ML LL (SYRINGE) ×6 IMPLANT
TOWEL OR 17X24 6PK STRL BLUE (TOWEL DISPOSABLE) ×6 IMPLANT
TUBE CONNECTING 12'X1/4 (SUCTIONS) ×1
TUBE CONNECTING 12X1/4 (SUCTIONS) ×2 IMPLANT
YANKAUER SUCT BULB TIP NO VENT (SUCTIONS) ×3 IMPLANT

## 2016-07-12 NOTE — Op Note (Signed)
Preop diagnosis: right inguinal hernia  Postop diagnosis: right indirect inguinal hernia  Procedure: open Right inguinal hernia repair with mesh  Surgeon: Gurney Maxin, M.D.  Asst: none  Anesthesia: Gen.   Indications for procedure: Cristian Larson is a 75 y.o. male with symptoms of pain and enlarging Right inguinal hernia(s). After discussing risks, alternatives and benefits he decided on open repair and was brought to day surgery for repair.  Description of procedure: The patient was brought into the operative suite, placed supine. Anesthesia was administered with endotracheal tube. Patient was strapped in place. The patient was prepped and draped in the usual sterile fashion.  The anterior superior iliac spine and pubic tubercle were identified on the Right side. An incision was made 1cm above the connecting line, representative of the location of the inguinal ligament. The subcutaneous tissue was bluntly dissected, scarpa's fascia was dissected away. The external abdominal oblique fascia was identified and sharply opened down to the external inguinal ring. The conjoint tendon and inguinal ligament were identified. The cord structures and sac were dissected free of the surrounding tissue in 360 degrees. A penrose drain was used to encircle the contents. The cremasteric fibers were dissected free of the contents of the cord and hernia sac. The cord structures (vessels and vas deferens) were identified and carefully dissected away from the hernia sac. The hernia sac was dissected down to the internal inguinal ring. Preperitoneal fat was identified showing appropriate dissection. There was a small lipoma removed with a portion of the sac, the sac was closed with 2-0 vicryl in running fashion. The sac was then reduced into the preperitoneal space. A 3x6 Bard Soft mesh was then used to close the defect and reinforce the floor. The mesh was sutured to the lacunar ligament and inguinal ligament using  a 2-0 prolene in running fashion. Next the superior edge of the mesh was sutured to the conjoined tendon using a 2-0 running Prolene. An additional 2-0 Prolene was used to suture the tail ends of the mesh together re-creating the deep ring. Cord structures are running in a neutral position through the mesh. Next the external abdominal oblique fascia was closed with a 3-0 Vicryl in running fashion to re-create the external inguinal ring. Scarpa's fascia was closed with 3-0 Vicryl in running fashion. Skin was closed with a 4-0 Monocryl subcuticular stitch in running fashion. Dermabond place for dressing. Patient woke from anesthesia and brought to PACU in stable condition. All counts are correct.    Findings: right indirect inguinal hernia  Specimen: none  Blood loss: <50 ml  Local anesthesia: 40 ml 1:1 Exparel:Saline, 77ml 0.5% marcaine w epi  Complications: none  Implant: Bard softmesh  Gurney Maxin, M.D. General, Bariatric, & Minimally Invasive Surgery [facility] 1:49 PM 07/12/2016

## 2016-07-12 NOTE — Anesthesia Preprocedure Evaluation (Signed)
Anesthesia Evaluation  Patient identified by MRN, date of birth, ID band Patient awake    Reviewed: Allergy & Precautions, H&P , Patient's Chart, lab work & pertinent test results, reviewed documented beta blocker date and time   Airway Mallampati: II  TM Distance: >3 FB Neck ROM: full    Dental no notable dental hx.    Pulmonary    Pulmonary exam normal breath sounds clear to auscultation       Cardiovascular  Rhythm:regular Rate:Normal     Neuro/Psych    GI/Hepatic   Endo/Other    Renal/GU      Musculoskeletal   Abdominal   Peds  Hematology   Anesthesia Other Findings   Reproductive/Obstetrics                             Anesthesia Physical Anesthesia Plan  ASA: II  Anesthesia Plan:    Post-op Pain Management:    Induction: Intravenous  Airway Management Planned: LMA  Additional Equipment:   Intra-op Plan:   Post-operative Plan:   Informed Consent: I have reviewed the patients History and Physical, chart, labs and discussed the procedure including the risks, benefits and alternatives for the proposed anesthesia with the patient or authorized representative who has indicated his/her understanding and acceptance.   Dental Advisory Given and Dental advisory given  Plan Discussed with: CRNA and Surgeon  Anesthesia Plan Comments: (Discussed GA with LMA, possible sore throat, potential need to switch to ETT, N/V, pulmonary aspiration. Questions answered. )        Anesthesia Quick Evaluation

## 2016-07-12 NOTE — Discharge Instructions (Signed)
°  Post Anesthesia Home Care Instructions  Activity: Get plenty of rest for the remainder of the day. A responsible adult should stay with you for 24 hours following the procedure.  For the next 24 hours, DO NOT: -Drive a car -Operate machinery -Drink alcoholic beverages -Take any medication unless instructed by your physician -Make any legal decisions or sign important papers.  Meals: Start with liquid foods such as gelatin or soup. Progress to regular foods as tolerated. Avoid greasy, spicy, heavy foods. If nausea and/or vomiting occur, drink only clear liquids until the nausea and/or vomiting subsides. Call your physician if vomiting continues.  Special Instructions/Symptoms: Your throat may feel dry or sore from the anesthesia or the breathing tube placed in your throat during surgery. If this causes discomfort, gargle with warm salt water. The discomfort should disappear within 24 hours.  If you had a scopolamine patch placed behind your ear for the management of post- operative nausea and/or vomiting:  1. The medication in the patch is effective for 72 hours, after which it should be removed.  Wrap patch in a tissue and discard in the trash. Wash hands thoroughly with soap and water. 2. You may remove the patch earlier than 72 hours if you experience unpleasant side effects which may include dry mouth, dizziness or visual disturbances. 3. Avoid touching the patch. Wash your hands with soap and water after contact with the patch.   Information for Discharge Teaching: EXPAREL (bupivacaine liposome injectable suspension)   Your surgeon gave you EXPAREL(bupivacaine) in your surgical incision to help control your pain after surgery.   EXPAREL is a local anesthetic that provides pain relief by numbing the tissue around the surgical site.  EXPAREL is designed to release pain medication over time and can control pain for up to 72 hours.  Depending on how you respond to EXPAREL, you may  require less pain medication during your recovery.  Possible side effects:  Temporary loss of sensation or ability to move in the area where bupivacaine was injected.  Nausea, vomiting, constipation  Rarely, numbness and tingling in your mouth or lips, lightheadedness, or anxiety may occur.  Call your doctor right away if you think you may be experiencing any of these sensations, or if you have other questions regarding possible side effects.  Follow all other discharge instructions given to you by your surgeon or nurse. Eat a healthy diet and drink plenty of water or other fluids.  If you return to the hospital for any reason within 96 hours following the administration of EXPAREL, please inform your health care providers. 

## 2016-07-12 NOTE — Transfer of Care (Signed)
    Last Vitals:  Filed Vitals:   07/12/16 1017  BP: 116/66  Pulse: 64  Temp: 36.3 C  Resp: 16    Last Pain: There were no vitals filed for this visit.       Immediate Anesthesia Transfer of Care Note  Patient: Cristian Larson  Procedure(s) Performed: Procedure(s) (LRB): OPEN RIGHT INGUINAL HERNIA REPAIR (Right) INSERTION OF MESH (Right)  Patient Location: PACU  Anesthesia Type: General  Level of Consciousness: awake, alert  and oriented  Airway & Oxygen Therapy: Patient Spontanous Breathing and Patient connected to nasal cannula oxygen  Post-op Assessment: Report given to PACU RN and Post -op Vital signs reviewed and stable  Post vital signs: Reviewed and stable  Complications: No apparent anesthesia complications

## 2016-07-12 NOTE — H&P (Signed)
Cristian Larson is an 75 y.o. male.   Chief Complaint: right inguinal hernia HPI: 75 yo male with symptomatic right inguinal hernia. It is larger when standing, no nausea or vomiting, it has never been difficult to reduce.  Past Medical History  Diagnosis Date  . 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 Right inguinal hernia   . 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 Wears hearing aid     right ear only    Past Surgical History  Procedure Laterality Date  . 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 Craniotomy  05/ 1999    w/ Left Occipital and Partial Mastoidectomy (acoustic neuroma)  . Appendectomy  1970's    History reviewed. No pertinent family history. Social History:  reports that he has never smoked. He has never used smokeless tobacco. He reports that he drinks about 8.4 oz of alcohol per week. He reports that he does not use illicit drugs.  Allergies: No Known Allergies  Medications Prior to Admission  Medication Sig Dispense Refill  . Coenzyme Q10 (COQ10) 100 MG CAPS Take by mouth daily.    . Cyanocobalamin (VITAMIN B-12) 1000 MCG/15ML LIQD Take by mouth daily. LIQUID DROPS    . donepezil (ARICEPT) 10 MG tablet Take 10 mg by mouth every morning.    . Multiple Vitamin (MULTIVITAMIN) tablet Take 1 tablet by mouth daily.    . Nutritional Supplements (DHEA PO) Take by mouth daily. ORAL LIQUID DROPS    . Omega 3 1000 MG CAPS Take by mouth daily.    . tamsulosin (FLOMAX) 0.4 MG CAPS capsule Take 0.4 mg by mouth daily after supper.   2    Results for orders placed or performed during the hospital encounter of 07/12/16 (from the past 48 hour(s))  Hemoglobin-hemacue, POC     Status: None   Collection Time: 07/12/16 10:51 AM  Result Value Ref Range   Hemoglobin 14.6  13.0 - 17.0 g/dL   No results found.  Review of Systems  Constitutional: Negative for fever and chills.  HENT: Negative for hearing loss.   Eyes: Negative for blurred vision and double vision.  Respiratory: Negative for cough and hemoptysis.   Cardiovascular: Negative for chest pain and palpitations.  Gastrointestinal: Negative for nausea, vomiting and abdominal pain.  Genitourinary: Negative for dysuria and urgency.  Musculoskeletal: Negative for myalgias and neck pain.  Skin: Negative for itching and rash.  Neurological: Negative for dizziness, tingling and headaches.  Endo/Heme/Allergies: Does not bruise/bleed easily.  Psychiatric/Behavioral: Negative for depression and suicidal ideas.    Blood pressure 116/66, pulse 64, temperature 97.4 F (36.3 C), temperature source Oral, resp. rate 16, height 6' (1.829 m), weight 76.204 kg (168 lb), SpO2 99 %. Physical Exam  Vitals reviewed. Constitutional: He is oriented to person, place, and time. He appears well-developed and well-nourished.  HENT:  Head: Normocephalic and atraumatic.  Eyes: Conjunctivae and EOM are normal. Pupils are equal, round, and reactive to light.  Neck: Normal range of motion. Neck supple.  Cardiovascular: Normal rate and regular rhythm.   Respiratory: Effort normal and breath sounds normal.  GI: Soft. Bowel sounds are normal. He exhibits no distension. There is no tenderness.  Musculoskeletal: Normal range of motion.  Neurological: He is alert and oriented to person, place, and time.  Skin: Skin is warm and dry.  Psychiatric: He has a normal mood and affect. His behavior is normal.     Assessment/Plan 75 yo male with right inguinal hernia -right open inguinal hernia repair with mesh  Mickeal Skinner, MD 07/12/2016, 12:15 PM

## 2016-07-12 NOTE — Anesthesia Procedure Notes (Signed)
Procedure Name: LMA Insertion Date/Time: 07/12/2016 12:37 PM Performed by: Mechele Claude Pre-anesthesia Checklist: Patient identified, Emergency Drugs available, Suction available and Patient being monitored Patient Re-evaluated:Patient Re-evaluated prior to inductionOxygen Delivery Method: Circle System Utilized Preoxygenation: Pre-oxygenation with 100% oxygen Intubation Type: IV induction Ventilation: Mask ventilation without difficulty LMA: LMA inserted LMA Size: 4.0 Number of attempts: 1 Airway Equipment and Method: bite block Placement Confirmation: positive ETCO2 Tube secured with: Tape Dental Injury: Teeth and Oropharynx as per pre-operative assessment

## 2016-07-13 ENCOUNTER — Encounter (HOSPITAL_BASED_OUTPATIENT_CLINIC_OR_DEPARTMENT_OTHER): Payer: Self-pay | Admitting: General Surgery

## 2016-07-13 NOTE — Anesthesia Postprocedure Evaluation (Signed)
Anesthesia Post Note  Patient: Cristian Larson  Procedure(s) Performed: Procedure(s) (LRB): OPEN RIGHT INGUINAL HERNIA REPAIR (Right) INSERTION OF MESH (Right)  Patient location during evaluation: PACU Anesthesia Type: General Level of consciousness: awake and alert Pain management: pain level controlled Vital Signs Assessment: post-procedure vital signs reviewed and stable Respiratory status: spontaneous breathing, nonlabored ventilation, respiratory function stable and patient connected to nasal cannula oxygen Cardiovascular status: blood pressure returned to baseline and stable Postop Assessment: no signs of nausea or vomiting Anesthetic complications: no     Last Vitals:  Filed Vitals:   07/12/16 1500 07/12/16 1630  BP: 125/69 145/62  Pulse: 75 76  Temp:  36.6 C  Resp: 23 18    Last Pain:  Filed Vitals:   07/12/16 1636  PainSc: Asleep   Pain Goal:                 [name]

## 2016-12-04 IMAGING — CT CT HEAD W/O CM
3 of 5 series · 14 of 47 positions shown, 16 images · non-contrast
Comparison: Head CT dated 03/10/2010 and MRI dated 11/23/2011 stop

CLINICAL DATA: 74-year-old male with wall

EXAM:
CT HEAD WITHOUT CONTRAST
CT CERVICAL SPINE WITHOUT CONTRAST
TECHNIQUE: Multidetector CT imaging of the head and cervical spine was
performed following the standard protocol without intravenous
contrast. Multiplanar CT image reconstructions of the cervical spine
were also generated.

[Series 7: coronals · coronal · 0.34mm/px · 3 of 57 slices shown]
[im 19/57  brain]
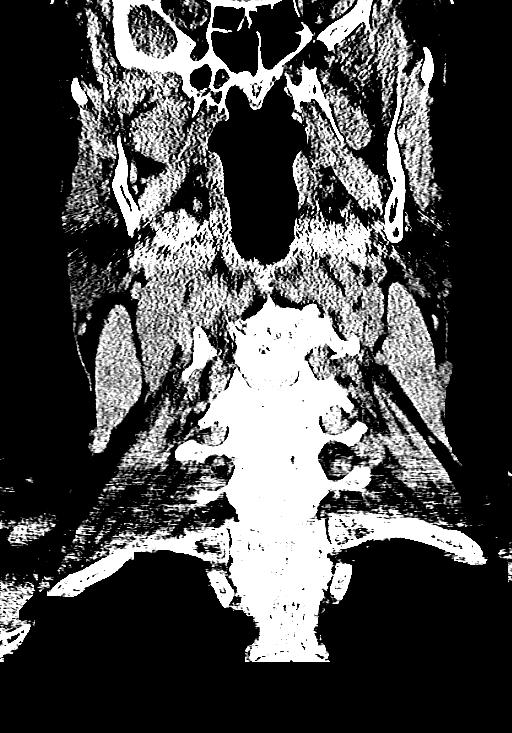
[im 25/57  brain]
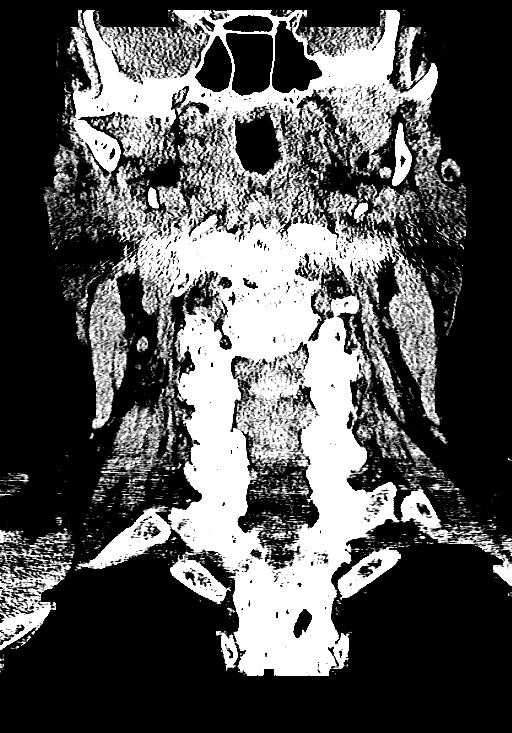
[im 32/57  brain]
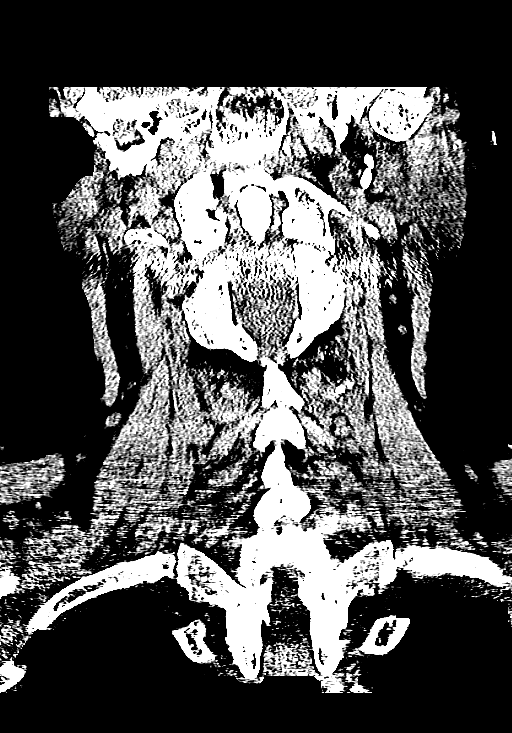

[Series 8: sagittals · sagittal · 0.41mm/px · 3 of 59 slices shown]
[im 20/59  brain]
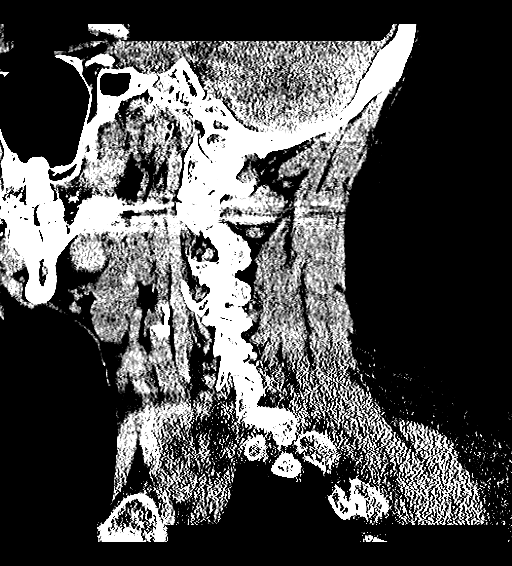
[im 30/59  brain]
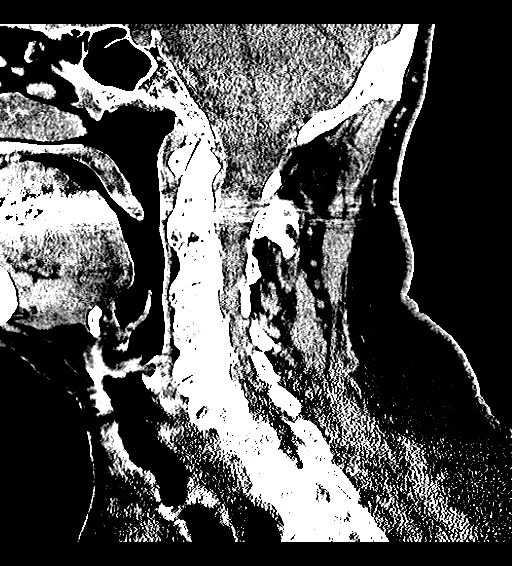
[im 39/59  brain]
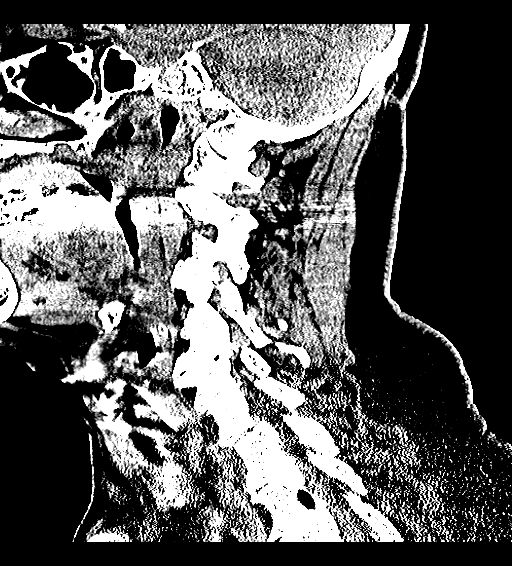

[Series 9: orthogonals · axial · 0.35mm/px · z∈[-310,-97]mm · 8 of 135 slices shown, 10 images]
[im 10/135  brain]
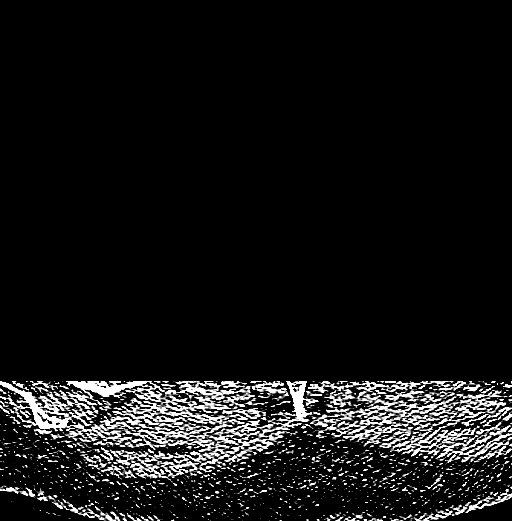
[im 10/135  bone]
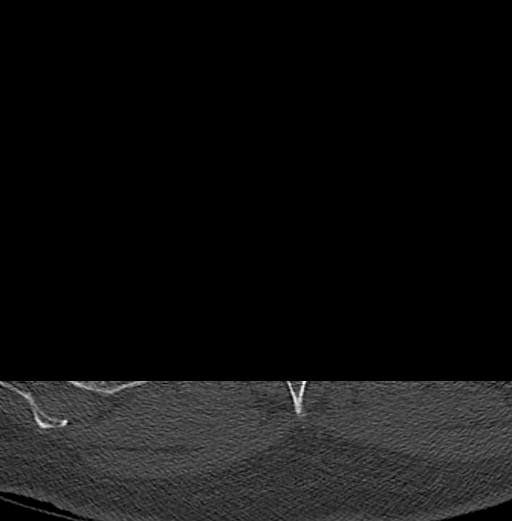
[im 29/135  brain]
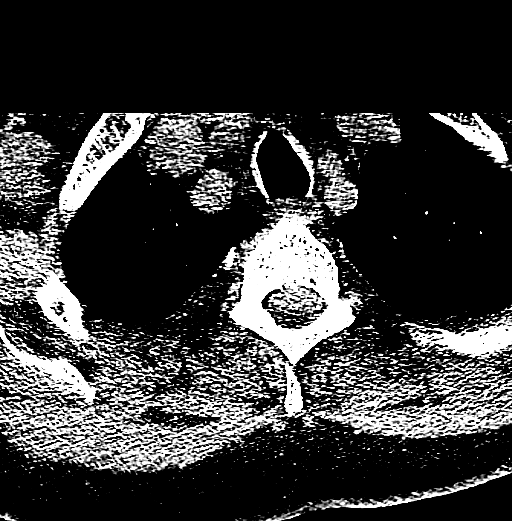
[im 48/135  brain]
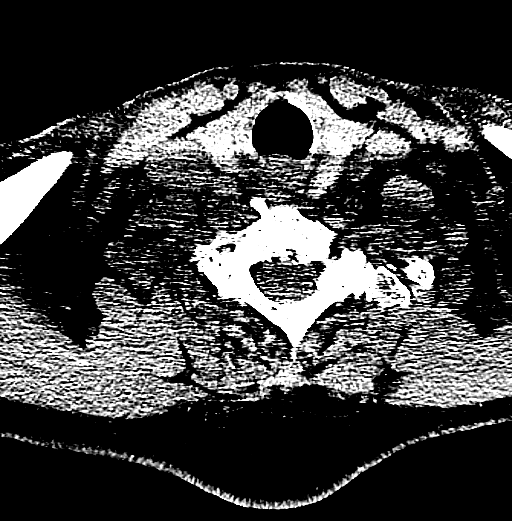
[im 58/135  brain]
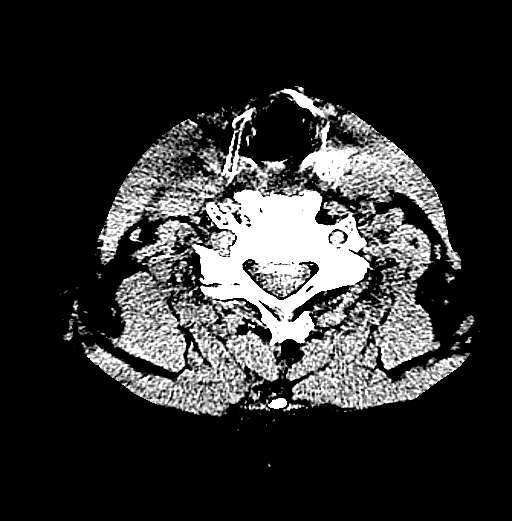
[im 77/135  brain]
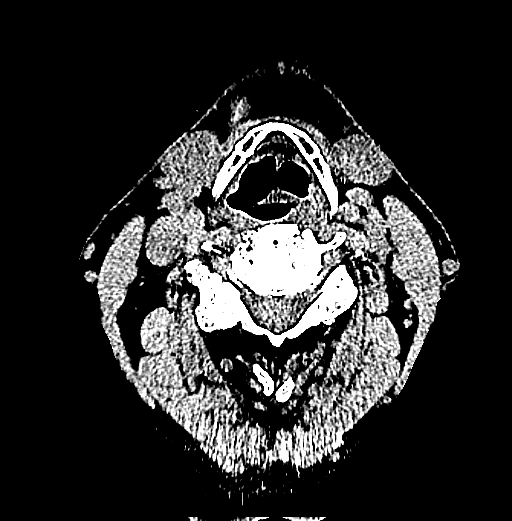
[im 77/135  bone]
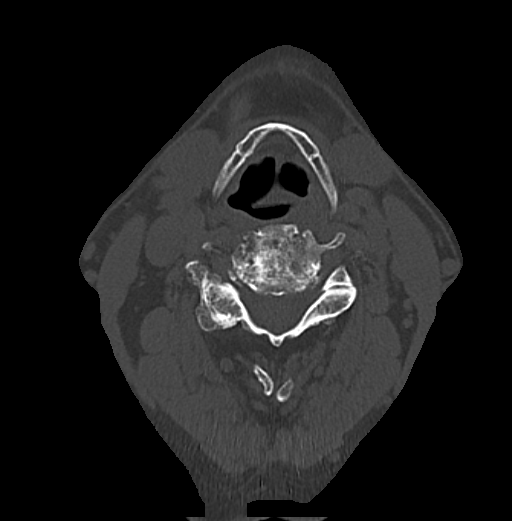
[im 87/135  brain]
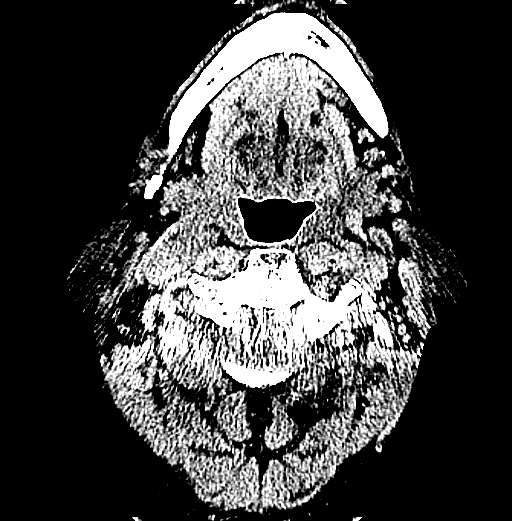
[im 106/135  brain]
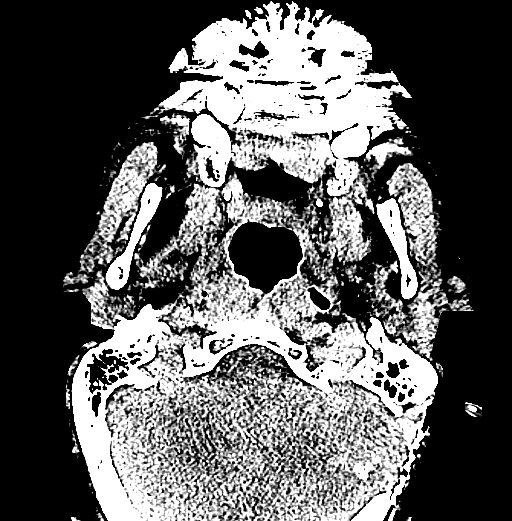
[im 125/135  brain]
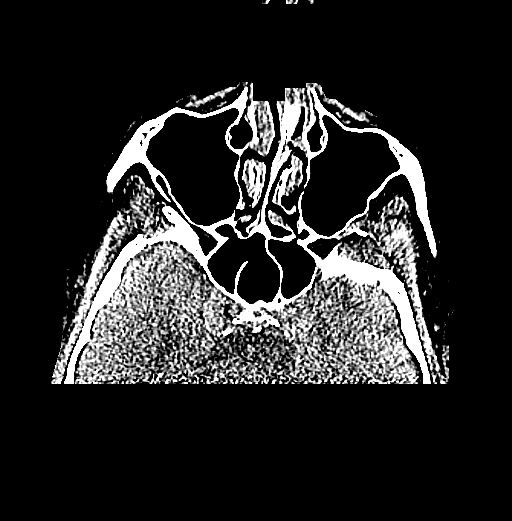

[14 of 47 positions shown; findings below may reference images not displayed]

FINDINGS: CT HEAD FINDINGS

There is stable prominence of the ventricles and sulci compatible
with age-related atrophy. There is stable left occipital craniectomy
with associated encephalomalacia and focal fat changes of the
adjacent cerebellar hemispheres similar to prior study. All for
Periventricular and deep white matter hypodensities represent
chronic microvascular ischemic changes. There is no intracranial
hemorrhage. No mass effect or midline shift identified. The
visualized paranasal sinuses and mastoid air cells are well aerated.
Metallic density in noted in the right external auditory canal
likely a hearing aid device. Clinical correlation is recommended.

CT CERVICAL SPINE FINDINGS

There is no acute fracture or subluxation of the cervical
spine.There is extensive multilevel degenerative changes of the
spine primarily involving C technique- 50. There is grade 1 C4-C5
anterolisthesis with a grade 1 C5-C6 retrolisthesis. The bones are
osteopenic. There multilevel facet hypertrophy. There is bilateral
neural foramina narrowing at C3-C4, left C4-C5, C5-C6.The odontoid
and spinous processes are intact.There is normal anatomic alignment
of the C1-C2 lateral masses. The visualized soft tissues appear
unremarkable.
IMPRESSION: No acute intracranial pathology.

No acute/traumatic cervical spine pathology.

## 2018-02-20 ENCOUNTER — Ambulatory Visit (INDEPENDENT_AMBULATORY_CARE_PROVIDER_SITE_OTHER): Payer: Medicare Other | Admitting: Podiatry

## 2018-02-20 ENCOUNTER — Encounter: Payer: Self-pay | Admitting: Podiatry

## 2018-02-20 DIAGNOSIS — L600 Ingrowing nail: Secondary | ICD-10-CM

## 2018-02-20 NOTE — Patient Instructions (Signed)

## 2018-04-16 ENCOUNTER — Ambulatory Visit: Payer: Medicare Other | Admitting: Podiatry

## 2018-04-21 ENCOUNTER — Ambulatory Visit: Payer: Medicare Other | Admitting: Podiatry

## 2024-09-04 ENCOUNTER — Encounter (HOSPITAL_COMMUNITY): Payer: Self-pay

## 2024-09-04 ENCOUNTER — Emergency Department (HOSPITAL_COMMUNITY)
Admission: EM | Admit: 2024-09-04 | Discharge: 2024-09-04 | Disposition: A | Discharge status: Home / Self Care | Attending: Emergency Medicine | Admitting: Emergency Medicine

## 2024-09-04 ENCOUNTER — Emergency Department (HOSPITAL_COMMUNITY)

## 2024-09-04 DIAGNOSIS — G319 Degenerative disease of nervous system, unspecified: Secondary | ICD-10-CM | POA: Insufficient documentation

## 2024-09-04 DIAGNOSIS — Z8673 Personal history of transient ischemic attack (TIA), and cerebral infarction without residual deficits: Secondary | ICD-10-CM | POA: Diagnosis not present

## 2024-09-04 DIAGNOSIS — I6782 Cerebral ischemia: Secondary | ICD-10-CM | POA: Insufficient documentation

## 2024-09-04 DIAGNOSIS — S01112A Laceration without foreign body of left eyelid and periocular area, initial encounter: Secondary | ICD-10-CM | POA: Diagnosis not present

## 2024-09-04 DIAGNOSIS — W19XXXA Unspecified fall, initial encounter: Secondary | ICD-10-CM | POA: Diagnosis not present

## 2024-09-04 DIAGNOSIS — Z23 Encounter for immunization: Secondary | ICD-10-CM | POA: Insufficient documentation

## 2024-09-04 DIAGNOSIS — R791 Abnormal coagulation profile: Secondary | ICD-10-CM | POA: Insufficient documentation

## 2024-09-04 DIAGNOSIS — S0592XA Unspecified injury of left eye and orbit, initial encounter: Secondary | ICD-10-CM | POA: Diagnosis present

## 2024-09-04 DIAGNOSIS — Y92129 Unspecified place in nursing home as the place of occurrence of the external cause: Secondary | ICD-10-CM | POA: Diagnosis not present

## 2024-09-04 DIAGNOSIS — S0181XA Laceration without foreign body of other part of head, initial encounter: Secondary | ICD-10-CM | POA: Insufficient documentation

## 2024-09-04 LAB — CBC
HCT: 32.5 % — ABNORMAL LOW (ref 39.0–52.0)
Hemoglobin: 10.3 g/dL — ABNORMAL LOW (ref 13.0–17.0)
MCH: 31.4 pg (ref 26.0–34.0)
MCHC: 31.7 g/dL (ref 30.0–36.0)
MCV: 99.1 fL (ref 80.0–100.0)
Platelets: 165 K/uL (ref 150–400)
RBC: 3.28 MIL/uL — ABNORMAL LOW (ref 4.22–5.81)
RDW: 13.2 % (ref 11.5–15.5)
WBC: 16.4 K/uL — ABNORMAL HIGH (ref 4.0–10.5)
nRBC: 0 % (ref 0.0–0.2)

## 2024-09-04 LAB — I-STAT CHEM 8, ED
BUN: 24 mg/dL — ABNORMAL HIGH (ref 8–23)
Calcium, Ion: 1.05 mmol/L — ABNORMAL LOW (ref 1.15–1.40)
Chloride: 105 mmol/L (ref 98–111)
Creatinine, Ser: 1.3 mg/dL — ABNORMAL HIGH (ref 0.61–1.24)
Glucose, Bld: 166 mg/dL — ABNORMAL HIGH (ref 70–99)
HCT: 31 % — ABNORMAL LOW (ref 39.0–52.0)
Hemoglobin: 10.5 g/dL — ABNORMAL LOW (ref 13.0–17.0)
Potassium: 3.5 mmol/L (ref 3.5–5.1)
Sodium: 141 mmol/L (ref 135–145)
TCO2: 21 mmol/L — ABNORMAL LOW (ref 22–32)

## 2024-09-04 LAB — COMPREHENSIVE METABOLIC PANEL WITH GFR
ALT: 13 U/L (ref 0–44)
AST: 19 U/L (ref 15–41)
Albumin: 2.8 g/dL — ABNORMAL LOW (ref 3.5–5.0)
Alkaline Phosphatase: 68 U/L (ref 38–126)
Anion gap: 9 (ref 5–15)
BUN: 24 mg/dL — ABNORMAL HIGH (ref 8–23)
CO2: 23 mmol/L (ref 22–32)
Calcium: 7.6 mg/dL — ABNORMAL LOW (ref 8.9–10.3)
Chloride: 106 mmol/L (ref 98–111)
Creatinine, Ser: 1.24 mg/dL (ref 0.61–1.24)
GFR, Estimated: 58 mL/min — ABNORMAL LOW (ref >60)
Glucose, Bld: 175 mg/dL — ABNORMAL HIGH (ref 70–99)
Potassium: 3.5 mmol/L (ref 3.5–5.1)
Sodium: 138 mmol/L (ref 135–145)
Total Bilirubin: 0.6 mg/dL (ref 0.0–1.2)
Total Protein: 5.4 g/dL — ABNORMAL LOW (ref 6.5–8.1)

## 2024-09-04 LAB — SAMPLE TO BLOOD BANK

## 2024-09-04 LAB — I-STAT CG4 LACTIC ACID, ED: Lactic Acid, Venous: 1.5 mmol/L (ref 0.5–1.9)

## 2024-09-04 LAB — PROTIME-INR
INR: 1.2 (ref 0.8–1.2)
Prothrombin Time: 15.5 s — ABNORMAL HIGH (ref 11.4–15.2)

## 2024-09-04 LAB — ETHANOL: Alcohol, Ethyl (B): <15 mg/dL (ref <15)

## 2024-09-04 LAB — HEMOGLOBIN AND HEMATOCRIT, BLOOD
HCT: 31.3 % — ABNORMAL LOW (ref 39.0–52.0)
Hemoglobin: 10 g/dL — ABNORMAL LOW (ref 13.0–17.0)

## 2024-09-04 MED ORDER — LIDOCAINE-EPINEPHRINE (PF) 2 %-1:200000 IJ SOLN
10.0000 mL | Freq: Once | INTRAMUSCULAR | Status: AC
  Administered 2024-09-04: 10 mL
  Filled 2024-09-04: qty 20

## 2024-09-04 MED ORDER — TETANUS-DIPHTH-ACELL PERTUSSIS 5-2.5-18.5 LF-MCG/0.5 IM SUSY
PREFILLED_SYRINGE | INTRAMUSCULAR | Status: AC
  Filled 2024-09-04: qty 0.5

## 2024-09-04 MED ORDER — SODIUM CHLORIDE 0.9 % IV BOLUS
1000.0000 mL | Freq: Once | INTRAVENOUS | Status: AC
  Administered 2024-09-04: 1000 mL via INTRAVENOUS

## 2024-09-04 MED ORDER — TETANUS-DIPHTH-ACELL PERTUSSIS 5-2.5-18.5 LF-MCG/0.5 IM SUSY
0.5000 mL | PREFILLED_SYRINGE | Freq: Once | INTRAMUSCULAR | Status: AC
  Administered 2024-09-04: 0.5 mL via INTRAMUSCULAR

## 2024-09-04 NOTE — ED Notes (Signed)
 Trauma Response Nurse Documentation   Cristian Larson is a 83 y.o. male arriving to [facility] ED via EMS  On No antithrombotic. Trauma was activated as a Level 2 by ED charge RN based on the following trauma criteria Discretion of Emergency Department Physician. Fall no thinners but hypotensive/syncopal Patient cleared for CT by Dr. Haze. Pt transported to CT with trauma response nurse present to monitor. RN remained with the patient throughout their absence from the department for clinical observation.   GCS 4/3/5.   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[facility])    s/p  resection 1999 left side--  residual left ear deaf   Right inguinal hernia    Wears hearing aid    right ear only     Past Surgical History:  Procedure Laterality Date   APPENDECTOMY  1970's   CRANIOTOMY  05/ 1999   w/ Left Occipital and Partial Mastoidectomy (acoustic neuroma)   INGUINAL HERNIA REPAIR Right 07/12/2016   Procedure: OPEN RIGHT INGUINAL HERNIA REPAIR;  Surgeon: Herlene Beverley Bureau, MD;  Location: [facility];  Service: General;  Laterality: Right;   INSERTION OF MESH Right 07/12/2016   Procedure: INSERTION OF MESH;  Surgeon: Herlene Beverley Bureau, MD;  Location: [facility];  Service: General;  Laterality: Right;   TOTAL HIP ARTHROPLASTY Right 08-22-2009       Initial Focused Assessment (If applicable, or please see trauma documentation): Confused male presents via EMS from SNF after an unwitnessed fall, found down by staff. Not following commands, not answering questions. Alert x0. Hx dementia, resides in memory care facility. Lac to left forehead, bleeding controlled. Per EMS, large amount of blood on scene.  Airway patent, BS  clear Bleeding from facial lac controlled GCS 12 PERRLA 2  CT's Completed:   CT Head and CT C-Spine   Interventions:  Trauma lab draw Portable chest and pelvis XRAY CT head/c-spine TDAP Wound care, lac repair by EDP  Plan for disposition:  Discharge home anticipated  Consults completed:  none   Event Summary: Presents via EMS from SNF after an unwitnessed fall, lac to left forehead with bleeding controlled. XRAYS completed, then escorted to CT. TDAP updated.  Bedside handoff with ED RN Natosha/Kayla.    [name]  Trauma Response RN  Please call TRN at [phone] for further assistance.

## 2024-09-04 NOTE — ED Notes (Signed)
 Pt head cleaned of blood with soap and water, pt would say ow when trying to clean around the laceration otherwise tolerated well.

## 2024-09-04 NOTE — ED Notes (Signed)
 Pt to CT

## 2024-09-04 NOTE — ED Notes (Signed)
Called [facility] for transport.  

## 2024-09-04 NOTE — Discharge Instructions (Signed)
 Laceration was repaired with absorbable sutures.  He will not require suture removal.  Watch for signs of infection.

## 2024-09-04 NOTE — Progress Notes (Signed)
 Orthopedic Tech Progress Note Patient Details:  Cristian Larson 04/10/1941 [id number]  Patient ID: Cristian Larson, male   DOB: Aug 30, 1941, 83 y.o.   MRN: [id number] Level II trauma FNT, no ortho tech orders at this time.   Cristian Larson L Jaydy Fitzhenry 09/04/2024, 4:09 AM

## 2024-09-04 NOTE — ED Triage Notes (Signed)
 Patient arrives fro [facility] memory unit. Staff reports patient had an unwitnessed fall. Patient is not on blood thinners per EMS but there was also of blood loss on scene. EMS reports the patient vagal ed when moving him to the stretcher with a heart rate of 30 and BP of 80/40.

## 2024-09-04 NOTE — ED Provider Notes (Signed)
 [place] EMERGENCY DEPARTMENT AT [facility] Provider Note   CSN: [id number] Arrival date & time: 09/04/24  0308     Patient presents with: Cristian Larson is a 83 y.o. male.   Brought to the emergency department after an unwitnessed fall at memory care nursing home.  Patient found on rounds, on the ground with a large amount of blood around him.  EMS report large head laceration.  Staff reports that he is at his mental status baseline.  EMS report that during transport he had an episode of low heart rate in the 30s and blood pressure dropped into the 80s.  He was given a small fluid bolus and blood pressure improved somewhat.       Prior to Admission medications   Medication Sig Start Date End Date Taking? Authorizing Provider  Coenzyme Q10 (COQ10) 100 MG CAPS Take by mouth daily.    [provider]  Cyanocobalamin (VITAMIN B-12) 1000 MCG/15ML LIQD Take by mouth daily. LIQUID DROPS    [provider]  donepezil (ARICEPT) 10 MG tablet Take 10 mg by mouth every morning.    [provider]  HYDROcodone -acetaminophen  (NORCO/VICODIN) 5-325 MG tablet Take 1-2 tablets by mouth every 6 (six) hours as needed for moderate pain. 07/12/16   Kinsinger, Herlene Righter, MD  ibuprofen  (ADVIL ,MOTRIN ) 800 MG tablet Take 1 tablet (800 mg total) by mouth every 8 (eight) hours as needed. 07/12/16   Kinsinger, Herlene Righter, MD  Multiple Vitamin (MULTIVITAMIN) tablet Take 1 tablet by mouth daily.    [provider]  Nutritional Supplements (DHEA PO) Take by mouth daily. ORAL LIQUID DROPS    [provider]  Omega 3 1000 MG CAPS Take by mouth daily.    [provider]  tamsulosin (FLOMAX) 0.4 MG CAPS capsule Take 0.4 mg by mouth daily after supper.  08/25/15   [provider]    Allergies: Patient has no known allergies.    Review of Systems  Updated Vital Signs BP (!) 102/57   Pulse 75   Temp 98.9 F (37.2 C)   Resp 18   Ht  6' (1.829 m)   Wt 81.6 kg   SpO2 100%   BMI 24.41 kg/m   Physical Exam Vitals and nursing note reviewed.  Constitutional:      General: He is not in acute distress.    Appearance: He is well-developed.  HENT:     Head: Normocephalic. Laceration present.      Mouth/Throat:     Mouth: Mucous membranes are moist.  Eyes:     General: Vision grossly intact. Gaze aligned appropriately.     Extraocular Movements: Extraocular movements intact.     Conjunctiva/sclera: Conjunctivae normal.  Cardiovascular:     Rate and Rhythm: Normal rate and regular rhythm.     Pulses: Normal pulses.     Heart sounds: Normal heart sounds, S1 normal and S2 normal. No murmur heard.    No friction rub. No gallop.  Pulmonary:     Effort: Pulmonary effort is normal. No respiratory distress.     Breath sounds: Normal breath sounds.  Abdominal:     Palpations: Abdomen is soft.     Tenderness: There is no abdominal tenderness. There is no guarding or rebound.     Hernia: No hernia is present.  Musculoskeletal:        General: No swelling.     Cervical back: Full passive range of motion without pain, normal  range of motion and neck supple. No pain with movement, spinous process tenderness or muscular tenderness. Normal range of motion.     Right lower leg: No edema.     Left lower leg: No edema.  Skin:    General: Skin is warm and dry.     Capillary Refill: Capillary refill takes less than 2 seconds.     Findings: Laceration present. No ecchymosis, erythema, lesion or wound.  Neurological:     General: No focal deficit present.     Mental Status: He is alert. Mental status is at baseline.     GCS: GCS eye subscore is 4. GCS verbal subscore is 5. GCS motor subscore is 6.     Cranial Nerves: Cranial nerves 2-12 are intact.     Sensory: Sensation is intact.     Motor: Motor function is intact. No weakness or abnormal muscle tone.     Coordination: Coordination is intact.     (all labs ordered are  listed, but only abnormal results are displayed) Labs Reviewed  COMPREHENSIVE METABOLIC PANEL WITH GFR - Abnormal; Notable for the following components:      Result Value   Glucose, Bld 175 (*)    BUN 24 (*)    Calcium 7.6 (*)    Total Protein 5.4 (*)    Albumin 2.8 (*)    GFR, Estimated 58 (*)    All other components within normal limits  CBC - Abnormal; Notable for the following components:   WBC 16.4 (*)    RBC 3.28 (*)    Hemoglobin 10.3 (*)    HCT 32.5 (*)    All other components within normal limits  PROTIME-INR - Abnormal; Notable for the following components:   Prothrombin Time 15.5 (*)    All other components within normal limits  HEMOGLOBIN AND HEMATOCRIT, BLOOD - Abnormal; Notable for the following components:   Hemoglobin 10.0 (*)    HCT 31.3 (*)    All other components within normal limits  I-STAT CHEM 8, ED - Abnormal; Notable for the following components:   BUN 24 (*)    Creatinine, Ser 1.30 (*)    Glucose, Bld 166 (*)    Calcium, Ion 1.05 (*)    TCO2 21 (*)    Hemoglobin 10.5 (*)    HCT 31.0 (*)    All other components within normal limits  ETHANOL  URINALYSIS, ROUTINE W REFLEX MICROSCOPIC  I-STAT CG4 LACTIC ACID, ED  SAMPLE TO BLOOD BANK    EKG: None  Radiology: DG Chest Port 1 View Result Date: 09/04/2024 CLINICAL DATA:  83 year old male with sepsis, unwitnessed fall. EXAM: PORTABLE CHEST 1 VIEW COMPARISON:  Chest radiographs 08/17/2009. FINDINGS: Portable AP supine view at 0309 hours. Lower lung volumes. Chronic but increased elevation of the right hemidiaphragm since 2010. Mild cardiomegaly. Other mediastinal contours are within normal limits. Visualized tracheal air column is within normal limits. Allowing for portable technique the lungs are clear. No pneumothorax or pleural effusion identified on this supine view. No edema or consolidation identified. No acute osseous abnormality identified. Negative visible bowel gas. IMPRESSION: Lower lung volumes.  Probable mild cardiomegaly since 2010. No acute cardiopulmonary abnormality identified. Electronically Signed   By: VEAR Hurst M.D.   On: 09/04/2024 04:16   CT HEAD WO CONTRAST Result Date: 09/04/2024 EXAM: CT HEAD AND CERVICAL SPINE 09/04/2024 03:44:14 AM TECHNIQUE: CT of the head and cervical spine was performed without the administration of intravenous contrast. Multiplanar reformatted images are provided  for review. Automated exposure control, iterative reconstruction, and/or weight based adjustment of the mA/kV was utilized to reduce the radiation dose to as low as reasonably achievable. COMPARISON: 04/27/2022 CLINICAL HISTORY: Head trauma, moderate-severe. Level 2 Trauma: Patient arrives from [facility] memory unit. Staff reports patient had an unwitnessed fall. FINDINGS: CT HEAD BRAIN AND VENTRICLES: No acute intracranial hemorrhage. No mass effect or midline shift. No abnormal extra-axial fluid collection. No evidence of acute infarct. No hydrocephalus. Chronic microvascular ischemia and generalized atrophy. 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 ORBITS: No acute abnormality. SINUSES AND MASTOIDS: No acute abnormality. SOFT TISSUES AND SKULL: No acute skull fracture. No acute soft tissue abnormality. Left retromastoid craniotomy. CT CERVICAL SPINE BONES AND ALIGNMENT: No acute fracture or traumatic malalignment. Chronic grade 1 anterolisthesis of C4. Grade 1 retrolisthesis of C5 on C5. DEGENERATIVE CHANGES: Multilevel advanced spondylosis, disc space height loss, and degenerative endplate changes greatest at C5-C6. Ankylosis of C3 and C4. Multilevel facet arthropathy. Moderate to advanced spinal canal narrowing at C5-C6 secondary to posterior disc osteophyte complex and facet arthropathy. SOFT TISSUES: No prevertebral soft tissue swelling. IMPRESSION: 1. No acute intracranial abnormality. 2. No acute fracture or traumatic malalignment of the cervical spine.  Electronically signed by: Norman Gatlin MD 09/04/2024 03:57 AM EDT RP Workstation: [id number]   CT CERVICAL SPINE WO CONTRAST Result Date: 09/04/2024 EXAM: CT HEAD AND CERVICAL SPINE 09/04/2024 03:44:14 AM TECHNIQUE: CT of the head and cervical spine was performed without the administration of intravenous contrast. Multiplanar reformatted images are provided for review. Automated exposure control, iterative reconstruction, and/or weight based adjustment of the mA/kV was utilized to reduce the radiation dose to as low as reasonably achievable. COMPARISON: 04/27/2022 CLINICAL HISTORY: Head trauma, moderate-severe. Level 2 Trauma: Patient arrives from [facility] memory unit. Staff reports patient had an unwitnessed fall. FINDINGS: CT HEAD BRAIN AND VENTRICLES: No acute intracranial hemorrhage. No mass effect or midline shift. No abnormal extra-axial fluid collection. No evidence of acute infarct. No hydrocephalus. Chronic microvascular ischemia and generalized atrophy. 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 ORBITS: No acute abnormality. SINUSES AND MASTOIDS: No acute abnormality. SOFT TISSUES AND SKULL: No acute skull fracture. No acute soft tissue abnormality. Left retromastoid craniotomy. CT CERVICAL SPINE BONES AND ALIGNMENT: No acute fracture or traumatic malalignment. Chronic grade 1 anterolisthesis of C4. Grade 1 retrolisthesis of C5 on C5. DEGENERATIVE CHANGES: Multilevel advanced spondylosis, disc space height loss, and degenerative endplate changes greatest at C5-C6. Ankylosis of C3 and C4. Multilevel facet arthropathy. Moderate to advanced spinal canal narrowing at C5-C6 secondary to posterior disc osteophyte complex and facet arthropathy. SOFT TISSUES: No prevertebral soft tissue swelling. IMPRESSION: 1. No acute intracranial abnormality. 2. No acute fracture or traumatic malalignment of the cervical spine. Electronically signed by: Norman Gatlin MD  09/04/2024 03:57 AM EDT RP Workstation: [id number]   DG Pelvis Portable Result Date: 09/04/2024 EXAM: 1 or 2 VIEW(S) XRAY OF THE PELVIS 09/04/2024 03:22:22 AM COMPARISON: 08/22/2009 CLINICAL HISTORY: Trauma. FINDINGS: BONES AND JOINTS: No acute fracture. No focal osseous lesion. No joint dislocation. 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 SOFT TISSUES: The soft tissues are unremarkable. Stool ball on the rectum. IMPRESSION: 1. No acute fracture. 2. 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 Electronically signed by: Norman Gatlin MD 09/04/2024 03:50 AM EDT RP Workstation: [id number]     .Laceration Repair  Date/Time: 09/04/2024 5:56 AM  Performed by: Haze Lonni PARAS, MD Authorized by: Haze Lonni PARAS, MD   Consent:  Consent obtained:  Emergent situation Universal protocol:    Test results available: yes     Imaging studies available: yes     Required blood products, implants, devices, and special equipment available: yes     Site/side marked: yes     Immediately prior to procedure, a time out was called: yes     Patient identity confirmed:  Hospital-assigned identification number Anesthesia:    Anesthesia method:  Local infiltration   Local anesthetic:  Lidocaine  2% WITH epi Laceration details:    Location:  Face   Face location:  L eyebrow   Length (cm):  4 Pre-procedure details:    Preparation:  Patient was prepped and draped in usual sterile fashion and imaging obtained to evaluate for foreign bodies Exploration:    Hemostasis achieved with:  Direct pressure   Contaminated: no   Treatment:    Area cleansed with:  Povidone-iodine   Amount of cleaning:  Standard   Irrigation solution:  Sterile saline   Irrigation volume:  500   Irrigation method:  Syringe   Debridement:  None   Undermining:  None Skin repair:    Repair method:  Sutures   Suture size:  5-0   Suture material:  Fast-absorbing gut   Suture technique:  Simple interrupted   Number of sutures:   10 Approximation:    Approximation:  Close Repair type:    Repair type:  Simple Post-procedure details:    Dressing:  Open (no dressing)   Procedure completion:  Tolerated well, no immediate complications    Medications Ordered in the ED  sodium chloride  0.9 % bolus 1,000 mL (0 mLs Intravenous Stopped 09/04/24 0436)  Tdap (BOOSTRIX ) injection 0.5 mL (0.5 mLs Intramuscular Given 09/04/24 0352)  lidocaine -EPINEPHrine  (XYLOCAINE  W/EPI) 2 %-1:200000 (PF) injection 10 mL (10 mLs Infiltration Given 09/04/24 0536)                                    Medical Decision Making Amount and/or Complexity of Data Reviewed Labs: ordered. Decision-making details documented in ED Course. Radiology: ordered and independent interpretation performed. Decision-making details documented in ED Course.  Risk Prescription drug management.   Differential diagnosis considered includes, but not limited to: Blunt trauma including intracranial injury, spinal injury, thoracic injury, intra-abdominal and retroperitoneal injury, orthopedic injury  Presents to the emergency department after a ground-level fall.  Patient found on the ground at skilled nursing facility, found have a laceration on his left eyebrow/forehead.  Patient is awake and alert at arrival.  He has fairly severe dementia, reportedly at his baseline per nursing home staff before he left the unit.  Moving all 4 extremities without deformity.  Chest and pelvis x-ray performed at arrival, no fractures or acute pathology.  Patient sent for CT head and cervical spine, no acute injuries noted.  Patient with complex laceration to the left forehead/eyebrow area.  This was repaired with fast-absorbing simple interrupted sutures.  Hemoglobin 10.3 at arrival.  Patient administered a liter of IV fluids.  Vital signs have remained stable.  Repeat hemoglobin is 10.0, no significant change.  Patient will be appropriate for return to nursing home.     Final  diagnoses:  Facial laceration, initial encounter  Fall, initial encounter    ED Discharge Orders     None          Marcelles Clinard, Lonni PARAS, MD 09/04/24 [phone]

## 2024-09-04 NOTE — ED Notes (Signed)
 Pt back from CT, connected to monitors

## 2024-09-04 NOTE — ED Notes (Signed)
 MD at bedside repairing laceration.
